# Patient Record
Sex: Male | Born: 1985 | Race: White | Hispanic: No | Smoking: Current every day smoker
Health system: Southern US, Community
[De-identification: ages and names within clinical notes are randomized; demographics above are authoritative.]

## PROBLEM LIST (undated history)

## (undated) DIAGNOSIS — F319 Bipolar disorder, unspecified: Secondary | ICD-10-CM

## (undated) DIAGNOSIS — F419 Anxiety disorder, unspecified: Secondary | ICD-10-CM

## (undated) DIAGNOSIS — R569 Unspecified convulsions: Secondary | ICD-10-CM

## (undated) DIAGNOSIS — M549 Dorsalgia, unspecified: Secondary | ICD-10-CM

## (undated) DIAGNOSIS — M542 Cervicalgia: Secondary | ICD-10-CM

## (undated) DIAGNOSIS — F32A Depression, unspecified: Secondary | ICD-10-CM

## (undated) HISTORY — DX: Depression, unspecified: F32.A

## (undated) HISTORY — PX: EYE SURGERY: SHX253

## (undated) HISTORY — PX: OTHER SURGICAL HISTORY: SHX169

---
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
## 2004-09-19 ENCOUNTER — Emergency Department (HOSPITAL_COMMUNITY): Admission: EM | Admit: 2004-09-19 | Discharge: 2004-09-19 | Payer: Self-pay | Admitting: *Deleted

## 2005-06-13 ENCOUNTER — Emergency Department (HOSPITAL_COMMUNITY): Admission: EM | Admit: 2005-06-13 | Discharge: 2005-06-13 | Payer: Self-pay | Admitting: Emergency Medicine

## 2005-06-18 ENCOUNTER — Ambulatory Visit (HOSPITAL_BASED_OUTPATIENT_CLINIC_OR_DEPARTMENT_OTHER): Admission: RE | Admit: 2005-06-18 | Discharge: 2005-06-18 | Payer: Self-pay | Admitting: Orthopedic Surgery

## 2005-06-18 ENCOUNTER — Encounter (INDEPENDENT_AMBULATORY_CARE_PROVIDER_SITE_OTHER): Payer: Self-pay | Admitting: *Deleted

## 2005-09-06 ENCOUNTER — Emergency Department (HOSPITAL_COMMUNITY): Admission: EM | Admit: 2005-09-06 | Discharge: 2005-09-07 | Payer: Self-pay | Admitting: Emergency Medicine

## 2005-09-08 ENCOUNTER — Emergency Department (HOSPITAL_COMMUNITY): Admission: EM | Admit: 2005-09-08 | Discharge: 2005-09-08 | Payer: Self-pay | Admitting: Emergency Medicine

## 2005-09-28 ENCOUNTER — Emergency Department (HOSPITAL_COMMUNITY): Admission: EM | Admit: 2005-09-28 | Discharge: 2005-09-28 | Payer: Self-pay | Admitting: Emergency Medicine

## 2006-12-13 ENCOUNTER — Emergency Department (HOSPITAL_COMMUNITY): Admission: EM | Admit: 2006-12-13 | Discharge: 2006-12-14 | Payer: Self-pay | Admitting: Emergency Medicine

## 2007-03-25 ENCOUNTER — Emergency Department (HOSPITAL_COMMUNITY): Admission: EM | Admit: 2007-03-25 | Discharge: 2007-03-25 | Payer: Self-pay | Admitting: Emergency Medicine

## 2007-04-30 ENCOUNTER — Emergency Department (HOSPITAL_COMMUNITY): Admission: EM | Admit: 2007-04-30 | Discharge: 2007-04-30 | Payer: Self-pay | Admitting: Emergency Medicine

## 2008-04-12 ENCOUNTER — Emergency Department (HOSPITAL_COMMUNITY): Admission: EM | Admit: 2008-04-12 | Discharge: 2008-04-12 | Payer: Self-pay | Admitting: Emergency Medicine

## 2008-05-01 ENCOUNTER — Ambulatory Visit (HOSPITAL_COMMUNITY): Admission: RE | Admit: 2008-05-01 | Discharge: 2008-05-01 | Payer: Self-pay | Admitting: Internal Medicine

## 2008-11-15 ENCOUNTER — Emergency Department (HOSPITAL_COMMUNITY): Admission: EM | Admit: 2008-11-15 | Discharge: 2008-11-15 | Payer: Self-pay | Admitting: Emergency Medicine

## 2010-03-22 ENCOUNTER — Ambulatory Visit (HOSPITAL_COMMUNITY)
Admission: RE | Admit: 2010-03-22 | Discharge: 2010-03-22 | Payer: Self-pay | Source: Home / Self Care | Attending: Internal Medicine | Admitting: Internal Medicine

## 2010-07-12 NOTE — Op Note (Signed)
Kyle [name], Kyle [name]                ACCOUNT NO.:  [id number]   MEDICAL RECORD NO.:  [id number]          PATIENT TYPE:  AMB   LOCATION:  DSC                          FACILITY:  [facility]   PHYSICIAN:  Artist Pais. Weingold, M.D.DATE OF BIRTH:  October 14, 1985   DATE OF PROCEDURE:  06/18/2005  DATE OF DISCHARGE:                                 OPERATIVE REPORT   PREOPERATIVE DIAGNOSIS:  Right arm ulnar nerve laceration, proximal to the  elbow.   POSTOPERATIVE DIAGNOSIS:  Right arm ulnar nerve laceration, proximal to the  elbow.   OPERATION/PROCEDURE:  Microscopic repair, right arm ulnar nerve using 9-0  nylon and a 7 mm neuro wrap tube.   SURGEON:  Artist Pais. Mina Marble, M.D.   ASSISTANT:  None.   ANESTHESIA:  General.   TOURNIQUET TIME:  One hour and five minutes.   COMPLICATIONS:  None.   DRAINS:  None.   DESCRIPTION OF PROCEDURE:  The patient was taken to the operating room and  after the induction adequate general anesthesia, the right upper extremity  was prepped and draped in the sterile fashion.  Esmarch was used to  exsanguinate the limb.  Tourniquet was then inflated to 250 mmHg.  At this  point in time an oblique laceration between the medial epicondyle and the  front process was extended proximally and distally and the fascia flaps were  raised accordingly.  Dissection was carried down to the area where the ulnar  nerve resides.  The ulnar nerve was seen and completely lacerated 5 cm  proximal to the elbow joint.  Both ends were retrieved.  The wound was  thoroughly irrigated. There was significant laceration to the medial head of  the triceps. This was debrided. This was repaired using 3-0 Vicryl.  The  nerve was then repaired under microscopic magnification using 9-0 nylon for  six sutures in an epineural repair followed by a placement of 7 mm  neurogenic tube to protect the repair.  The wound was then irrigated and  closed with 4-0 nylon.  A sterile dressing with Xeroform,  4 x 4's and a  splint with the elbow in 45 degrees of flexion was applied.  The patient  tolerated the procedure well and left the operating room in stable  condition.      Artist Pais Mina Marble, M.D.  Electronically Signed    MAW/MEDQ  D:  06/18/2005  T:  06/19/2005  Job:  [id number]

## 2010-11-14 LAB — BASIC METABOLIC PANEL
BUN: 9
CO2: 24
Calcium: 9.1
Chloride: 103
Creatinine, Ser: 0.87
GFR calc Af Amer: >60
GFR calc non Af Amer: >60
Glucose, Bld: 100 — ABNORMAL HIGH
Potassium: 4.5
Sodium: 137

## 2010-11-14 LAB — DIFFERENTIAL
Basophils Absolute: 0
Basophils Relative: 0
Eosinophils Absolute: 0.3
Eosinophils Relative: 3
Lymphocytes Relative: 18
Lymphs Abs: 1.5
Monocytes Absolute: 0.6
Monocytes Relative: 8
Neutro Abs: 5.8
Neutrophils Relative %: 71

## 2010-11-14 LAB — CBC
HCT: 42.7
Hemoglobin: 15
MCHC: 35.1
MCV: 91.6
Platelets: 191
RBC: 4.66
RDW: 14
WBC: 8.2

## 2010-11-18 LAB — INFLUENZA A+B VIRUS AG-DIRECT(RAPID)
Inflenza A Ag: POSITIVE — AB
Influenza B Ag: NEGATIVE

## 2010-12-08 ENCOUNTER — Emergency Department (HOSPITAL_COMMUNITY): Payer: Medicaid Other

## 2010-12-08 ENCOUNTER — Observation Stay (HOSPITAL_COMMUNITY)
Admission: EM | Admit: 2010-12-08 | Discharge: 2010-12-09 | Disposition: A | Discharge status: Home / Self Care | Payer: Medicaid Other | Attending: Neurological Surgery | Admitting: Neurological Surgery

## 2010-12-08 DIAGNOSIS — S129XXA Fracture of neck, unspecified, initial encounter: Secondary | ICD-10-CM

## 2010-12-08 DIAGNOSIS — S27329A Contusion of lung, unspecified, initial encounter: Secondary | ICD-10-CM

## 2010-12-08 DIAGNOSIS — G8929 Other chronic pain: Secondary | ICD-10-CM | POA: Insufficient documentation

## 2010-12-08 DIAGNOSIS — Z01812 Encounter for preprocedural laboratory examination: Secondary | ICD-10-CM | POA: Insufficient documentation

## 2010-12-08 DIAGNOSIS — F411 Generalized anxiety disorder: Secondary | ICD-10-CM

## 2010-12-08 DIAGNOSIS — M549 Dorsalgia, unspecified: Secondary | ICD-10-CM | POA: Insufficient documentation

## 2010-12-08 DIAGNOSIS — S12100A Unspecified displaced fracture of second cervical vertebra, initial encounter for closed fracture: Principal | ICD-10-CM | POA: Insufficient documentation

## 2010-12-08 DIAGNOSIS — IMO0002 Reserved for concepts with insufficient information to code with codable children: Secondary | ICD-10-CM

## 2010-12-08 DIAGNOSIS — Y998 Other external cause status: Secondary | ICD-10-CM | POA: Insufficient documentation

## 2010-12-08 LAB — DIFFERENTIAL
Basophils Relative: 0 % (ref 0–1)
Lymphocytes Relative: 23 % (ref 12–46)
Lymphs Abs: 1.7 10*3/uL (ref 0.7–4.0)
Monocytes Absolute: 0.8 10*3/uL (ref 0.1–1.0)
Monocytes Relative: 11 % (ref 3–12)
Neutro Abs: 4.6 10*3/uL (ref 1.7–7.7)
Neutrophils Relative %: 62 % (ref 43–77)

## 2010-12-08 LAB — CBC
HCT: 40.7 % (ref 39.0–52.0)
Hemoglobin: 14.3 g/dL (ref 13.0–17.0)
MCH: 32.5 pg (ref 26.0–34.0)
RBC: 4.4 MIL/uL (ref 4.22–5.81)

## 2010-12-08 LAB — POCT I-STAT, CHEM 8
BUN: 14 mg/dL (ref 6–23)
Chloride: 105 mEq/L (ref 96–112)
Creatinine, Ser: 1 mg/dL (ref 0.50–1.35)
Potassium: 3.8 mEq/L (ref 3.5–5.1)
Sodium: 142 mEq/L (ref 135–145)
TCO2: 25 mmol/L (ref 0–100)

## 2010-12-08 LAB — VALPROIC ACID LEVEL: Valproic Acid Lvl: 33.6 ug/mL — ABNORMAL LOW (ref 50.0–100.0)

## 2010-12-08 MED ORDER — IOHEXOL 300 MG/ML  SOLN
80.0000 mL | Freq: Once | INTRAMUSCULAR | Status: AC | PRN
  Administered 2010-12-08: 80 mL via INTRAVENOUS

## 2010-12-08 MED ORDER — IOHEXOL 350 MG/ML SOLN
50.0000 mL | Freq: Once | INTRAVENOUS | Status: AC | PRN
  Administered 2010-12-08: 50 mL via INTRAVENOUS
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
## 2011-01-01 ENCOUNTER — Other Ambulatory Visit: Payer: Self-pay | Admitting: Neurological Surgery

## 2011-01-01 ENCOUNTER — Ambulatory Visit
Admission: RE | Admit: 2011-01-01 | Discharge: 2011-01-01 | Disposition: A | Discharge status: Home / Self Care | Payer: No Typology Code available for payment source | Source: Ambulatory Visit | Attending: Neurological Surgery | Admitting: Neurological Surgery

## 2011-01-01 DIAGNOSIS — S12100A Unspecified displaced fracture of second cervical vertebra, initial encounter for closed fracture: Secondary | ICD-10-CM

## 2011-03-07 ENCOUNTER — Encounter (HOSPITAL_COMMUNITY): Payer: Self-pay

## 2011-03-07 ENCOUNTER — Emergency Department (HOSPITAL_COMMUNITY)
Admission: EM | Admit: 2011-03-07 | Discharge: 2011-03-07 | Disposition: A | Discharge status: Home / Self Care | Payer: Medicaid Other | Attending: Emergency Medicine | Admitting: Emergency Medicine

## 2011-03-07 ENCOUNTER — Emergency Department (HOSPITAL_COMMUNITY): Payer: Medicaid Other

## 2011-03-07 DIAGNOSIS — G40909 Epilepsy, unspecified, not intractable, without status epilepticus: Secondary | ICD-10-CM | POA: Insufficient documentation

## 2011-03-07 DIAGNOSIS — W298XXA Contact with other powered powered hand tools and household machinery, initial encounter: Secondary | ICD-10-CM | POA: Insufficient documentation

## 2011-03-07 DIAGNOSIS — M79609 Pain in unspecified limb: Secondary | ICD-10-CM | POA: Insufficient documentation

## 2011-03-07 DIAGNOSIS — S61219A Laceration without foreign body of unspecified finger without damage to nail, initial encounter: Secondary | ICD-10-CM

## 2011-03-07 DIAGNOSIS — S61209A Unspecified open wound of unspecified finger without damage to nail, initial encounter: Secondary | ICD-10-CM | POA: Insufficient documentation

## 2011-03-07 HISTORY — DX: Cervicalgia: M54.2

## 2011-03-07 HISTORY — DX: Anxiety disorder, unspecified: F41.9

## 2011-03-07 HISTORY — DX: Unspecified convulsions: R56.9

## 2011-03-07 MED ORDER — LIDOCAINE HCL (PF) 1 % IJ SOLN
INTRAMUSCULAR | Status: AC
  Administered 2011-03-07: 5 mL
  Filled 2011-03-07: qty 5

## 2011-03-07 NOTE — ED Notes (Signed)
Pt presents with laceration to right ring finger. Bleeding controlled.

## 2011-03-07 NOTE — ED Provider Notes (Signed)
History     CSN: [id number]  Arrival date & time 03/07/11  1225   First MD Initiated Contact with Patient 03/07/11 1317      Chief Complaint  Patient presents with  . Laceration    (Consider location/radiation/quality/duration/timing/severity/associated sxs/prior treatment) HPI Comments: Pt cut R ring finger with a saws-all.  The history is provided by the patient. No language interpreter was used.    Past Medical History  Diagnosis Date  . Seizures   . Anxiety   . Neck pain     History reviewed. No pertinent past surgical history.  No family history on file.  History  Substance Use Topics  . Smoking status: Current Everyday Smoker -- 1.0 packs/day  . Smokeless tobacco: Not on file  . Alcohol Use: No      Review of Systems  Skin: Positive for wound.  All other systems reviewed and are negative.    Allergies  Review of patient's allergies indicates no known allergies.  Home Medications  No current outpatient prescriptions on file.  BP 106/55  Pulse 96  Temp(Src) 98.2 F (36.8 C) (Oral)  Resp 18  SpO2 100%  Physical Exam  Nursing note and vitals reviewed. Constitutional: He is oriented to person, place, and time. He appears well-developed and well-nourished.  HENT:  Head: Normocephalic and atraumatic.  Eyes: EOM are normal.  Neck: Normal range of motion.  Cardiovascular: Normal rate, regular rhythm, normal heart sounds and intact distal pulses.   Pulmonary/Chest: Effort normal and breath sounds normal. No respiratory distress.  Abdominal: Soft. He exhibits no distension. There is no tenderness.  Musculoskeletal: Normal range of motion. He exhibits tenderness.       Right hand: He exhibits tenderness and laceration. He exhibits normal range of motion, no bony tenderness, normal two-point discrimination, normal capillary refill, no deformity and no swelling. normal sensation noted. Normal strength noted.       Hands: Neurological: He is alert and  oriented to person, place, and time.  Skin: Skin is warm and dry.  Psychiatric: He has a normal mood and affect. Judgment normal.    ED Course  LACERATION REPAIR Date/Time: 03/07/2011 3:12 PM Performed by: Worthy Rancher Authorized by: Worthy Rancher Consent: Verbal consent obtained. Written consent not obtained. Risks and benefits: risks, benefits and alternatives were discussed Consent given by: patient Patient understanding: patient states understanding of the procedure being performed Patient consent: the patient's understanding of the procedure matches consent given Site marked: the operative site was not marked Imaging studies: imaging studies available Required items: required blood products, implants, devices, and special equipment available Patient identity confirmed: verbally with patient Time out: Immediately prior to procedure a "time out" was called to verify the correct patient, procedure, equipment, support staff and site/side marked as required. Laceration length: 2.3 cm Foreign bodies: no foreign bodies Tendon involvement: none Nerve involvement: none Vascular damage: no Anesthesia: local infiltration Local anesthetic: lidocaine 1% without epinephrine Anesthetic total: 3 ml Patient sedated: no Preparation: Patient was prepped and draped in the usual sterile fashion. Irrigation solution: saline Irrigation method: syringe Amount of cleaning: extensive Debridement: none Degree of undermining: none Skin closure: 4-0 nylon Number of sutures: 4 Technique: simple Approximation: loose Approximation difficulty: simple Dressing: antibiotic ointment and 4x4 sterile gauze Patient tolerance: Patient tolerated the procedure well with no immediate complications.   (including critical care time)  Labs Reviewed - No data to display Dg Finger Ring Right  03/07/2011  *RADIOLOGY REPORT*  Clinical Data: Laceration.  RIGHT RING FINGER 2+V  Comparison: None  Findings: No  radiopaque foreign bodies.  No fracture, subluxation or dislocation.  Joint spaces are maintained.  IMPRESSION: No acute bony abnormality.  Original Report Authenticated By: Cyndie Chime, M.D.     No diagnosis found.    MDM          Worthy Rancher, PA 03/07/11 1535

## 2011-03-07 NOTE — ED Notes (Signed)
Pt's hand cleaned and dressed.

## 2011-03-08 NOTE — ED Provider Notes (Signed)
Medical screening examination/treatment/procedure(s) were performed by non-physician practitioner and as supervising physician I was immediately available for consultation/collaboration.  Merna Baldi S. Gerarda Conklin, MD 03/08/11 2230 

## 2011-03-18 ENCOUNTER — Other Ambulatory Visit: Payer: Self-pay | Admitting: Neurological Surgery

## 2011-03-18 ENCOUNTER — Ambulatory Visit
Admission: RE | Admit: 2011-03-18 | Discharge: 2011-03-18 | Disposition: A | Discharge status: Home / Self Care | Payer: Medicaid Other | Source: Ambulatory Visit | Attending: Neurological Surgery | Admitting: Neurological Surgery

## 2011-03-18 DIAGNOSIS — S12100A Unspecified displaced fracture of second cervical vertebra, initial encounter for closed fracture: Secondary | ICD-10-CM

## 2011-03-18 DIAGNOSIS — S129XXA Fracture of neck, unspecified, initial encounter: Secondary | ICD-10-CM

## 2011-04-21 ENCOUNTER — Ambulatory Visit
Admission: RE | Admit: 2011-04-21 | Discharge: 2011-04-21 | Disposition: A | Discharge status: Home / Self Care | Payer: Medicaid Other | Source: Ambulatory Visit | Attending: Neurological Surgery | Admitting: Neurological Surgery

## 2011-04-21 DIAGNOSIS — S129XXA Fracture of neck, unspecified, initial encounter: Secondary | ICD-10-CM

## 2012-04-12 ENCOUNTER — Emergency Department (HOSPITAL_COMMUNITY): Payer: Medicaid Other

## 2012-04-12 ENCOUNTER — Emergency Department (HOSPITAL_COMMUNITY)
Admission: EM | Admit: 2012-04-12 | Discharge: 2012-04-12 | Disposition: A | Discharge status: Home / Self Care | Payer: Medicaid Other | Attending: Emergency Medicine | Admitting: Emergency Medicine

## 2012-04-12 ENCOUNTER — Encounter (HOSPITAL_COMMUNITY): Payer: Self-pay | Admitting: *Deleted

## 2012-04-12 DIAGNOSIS — F411 Generalized anxiety disorder: Secondary | ICD-10-CM | POA: Insufficient documentation

## 2012-04-12 DIAGNOSIS — B349 Viral infection, unspecified: Secondary | ICD-10-CM

## 2012-04-12 DIAGNOSIS — B9789 Other viral agents as the cause of diseases classified elsewhere: Secondary | ICD-10-CM | POA: Insufficient documentation

## 2012-04-12 DIAGNOSIS — Z79899 Other long term (current) drug therapy: Secondary | ICD-10-CM | POA: Insufficient documentation

## 2012-04-12 DIAGNOSIS — J069 Acute upper respiratory infection, unspecified: Secondary | ICD-10-CM

## 2012-04-12 DIAGNOSIS — J3489 Other specified disorders of nose and nasal sinuses: Secondary | ICD-10-CM | POA: Insufficient documentation

## 2012-04-12 DIAGNOSIS — J029 Acute pharyngitis, unspecified: Secondary | ICD-10-CM | POA: Insufficient documentation

## 2012-04-12 DIAGNOSIS — G40909 Epilepsy, unspecified, not intractable, without status epilepticus: Secondary | ICD-10-CM | POA: Insufficient documentation

## 2012-04-12 DIAGNOSIS — R110 Nausea: Secondary | ICD-10-CM | POA: Insufficient documentation

## 2012-04-12 DIAGNOSIS — R197 Diarrhea, unspecified: Secondary | ICD-10-CM | POA: Insufficient documentation

## 2012-04-12 DIAGNOSIS — F17200 Nicotine dependence, unspecified, uncomplicated: Secondary | ICD-10-CM | POA: Insufficient documentation

## 2012-04-12 DIAGNOSIS — R51 Headache: Secondary | ICD-10-CM | POA: Insufficient documentation

## 2012-04-12 HISTORY — DX: Dorsalgia, unspecified: M54.9

## 2012-04-12 LAB — RAPID STREP SCREEN (MED CTR MEBANE ONLY): Streptococcus, Group A Screen (Direct): NEGATIVE

## 2012-04-12 MED ORDER — BENZONATATE 100 MG PO CAPS: 100.0000 mg | ORAL_CAPSULE | Freq: Three times a day (TID) | ORAL | Status: DC | PRN

## 2012-04-12 NOTE — ED Notes (Signed)
Nausea,diarrheax3-4 ,no vomiting.  Cough, sneezing, headache

## 2012-04-12 NOTE — ED Notes (Signed)
Discharge instructions reviewed with pt, questions answered. Pt verbalized understanding.  

## 2012-04-12 NOTE — ED Provider Notes (Signed)
History     CSN: [id number]  Arrival date & time 04/12/12  2013   First MD Initiated Contact with Patient 04/12/12 2021      Chief Complaint  Patient presents with  . Cough     HPI Pt was seen at 2025.   Per pt, c/o gradual onset and persistence of constant sore throat, runny/stuffy nose, sinus congestion, sneezing, cough, nausea, and several episodes of "diarrhea" for the past 3 to 4 days.  Denies fevers, no rash, no CP/SOB, no N/V/D, no abd pain.     Past Medical History  Diagnosis Date  . Anxiety   . Neck pain   . Seizures   . Back pain     Past Surgical History  Procedure Laterality Date  . Arm surgery       History  Substance Use Topics  . Smoking status: Current Every Day Smoker -- 1.00 packs/day  . Smokeless tobacco: Not on file  . Alcohol Use: No    Review of Systems ROS: Statement: All systems negative except as marked or noted in the HPI; Constitutional: Negative for fever and chills. ; ; Eyes: Negative for eye pain, redness and discharge. ; ; ENMT: Negative for ear pain, hoarseness, +nasal congestion, runny/stuffy nose, sneezing, sinus pressure and sore throat. ; ; Cardiovascular: Negative for chest pain, palpitations, diaphoresis, dyspnea and peripheral edema. ; ; Respiratory: +cough. Negative for wheezing and stridor. ; ; Gastrointestinal: +nausea, diarrhea. Negative for vomiting, abdominal pain, blood in stool, hematemesis, jaundice and rectal bleeding. . ; ; Genitourinary: Negative for dysuria, flank pain and hematuria. ; ; Musculoskeletal: Negative for back pain and neck pain. Negative for swelling and trauma.; ; Skin: Negative for pruritus, rash, abrasions, blisters, bruising and skin lesion.; ; Neuro: Negative for headache, lightheadedness and neck stiffness. Negative for weakness, altered level of consciousness , altered mental status, extremity weakness, paresthesias, involuntary movement, seizure and syncope.       Allergies  Review of patient's  allergies indicates no known allergies.  Home Medications   Current Outpatient Rx  Name  Route  Sig  Dispense  Refill  . alprazolam (XANAX) 2 MG tablet   Oral   Take 2 mg by mouth 4 (four) times daily.         Marland Kitchen Dextromethorphan-Guaifenesin (CHEST CONGESTION/COUGH RELIEF) 20-400 MG TABS   Oral   Take 2 tablets by mouth 2 (two) times daily as needed (for cough, cold, and congestion relief).         Marland Kitchen divalproex (DEPAKOTE) 250 MG DR tablet   Oral   Take 250 mg by mouth 3 (three) times daily.         Marland Kitchen HYDROcodone-acetaminophen (LORTAB) 10-500 MG per tablet   Oral   Take 1 tablet by mouth every 6 (six) hours as needed for pain.         . benzonatate (TESSALON) 100 MG capsule   Oral   Take 1 capsule (100 mg total) by mouth 3 (three) times daily as needed for cough.   15 capsule   0     BP 115/67  Pulse 74  Temp(Src) 98.1 F (36.7 C) (Oral)  Resp 20  Ht 5\' 10"  (1.778 m)  Wt 130 lb (58.968 kg)  BMI 18.65 kg/m2  SpO2 100%  Physical Exam 2030: Physical examination:  Nursing notes reviewed; Vital signs and O2 SAT reviewed;  Constitutional: Well developed, Well nourished, Well hydrated, In no acute distress; Head:  Normocephalic, atraumatic; Eyes: EOMI, PERRL, No  scleral icterus; ENMT: TM's clear bilat. +edemetous nasal turbinates bilat with clear rhinorrhea.  Mouth and pharynx normal, Mucous membranes moist; Neck: Supple, Full range of motion, No lymphadenopathy; Cardiovascular: Regular rate and rhythm, No murmur, rub, or gallop; Respiratory: Breath sounds clear & equal bilaterally, No rales, rhonchi, wheezes.  Speaking full sentences with ease, Normal respiratory effort/excursion; Chest: Nontender, Movement normal; Abdomen: Soft, Nontender, Nondistended, Normal bowel sounds; Genitourinary: No CVA tenderness; Extremities: Pulses normal, No tenderness, No edema, No calf edema or asymmetry.; Neuro: AA&Ox3, Major CN grossly intact.  Speech clear. Gait steady. No gross focal  motor or sensory deficits in extremities.; Skin: Color normal, Warm, Dry.   ED Course  Procedures      MDM  MDM Reviewed: nursing note and vitals Interpretation: x-ray and labs     Results for orders placed during the hospital encounter of 04/12/12  RAPID STREP SCREEN      Result Value Range   Streptococcus, Group A Screen (Direct) NEGATIVE  NEGATIVE   Dg Chest 2 View 04/12/2012  *RADIOLOGY REPORT*  Clinical Data: Cough.  CHEST - 2 VIEW  Comparison: CT 12/08/2010  Findings: Lungs mildly hyperinflated. Lungs clear.  Heart size and pulmonary vascularity normal.  No effusion.  Visualized bones unremarkable.  IMPRESSION: No acute disease   Original Report Authenticated By: D. Andria Rhein, MD      2110:  No acute findings on workup.  Will tx symptomatically at this time. Dx and testing d/w pt and family.  Questions answered.  Verb understanding, agreeable to d/c home with outpt f/u.       Laray Anger, DO 04/13/12 1332

## 2012-05-13 ENCOUNTER — Other Ambulatory Visit (HOSPITAL_COMMUNITY): Payer: Self-pay | Admitting: Internal Medicine

## 2012-05-17 ENCOUNTER — Encounter (HOSPITAL_COMMUNITY): Payer: Self-pay

## 2012-05-17 ENCOUNTER — Ambulatory Visit (HOSPITAL_COMMUNITY)
Admission: RE | Admit: 2012-05-17 | Discharge: 2012-05-17 | Disposition: A | Discharge status: Home / Self Care | Payer: Medicaid Other | Source: Ambulatory Visit | Attending: Internal Medicine | Admitting: Internal Medicine

## 2012-05-17 DIAGNOSIS — M545 Low back pain, unspecified: Secondary | ICD-10-CM | POA: Insufficient documentation

## 2012-05-17 DIAGNOSIS — M5126 Other intervertebral disc displacement, lumbar region: Secondary | ICD-10-CM | POA: Insufficient documentation

## 2012-05-17 DIAGNOSIS — M549 Dorsalgia, unspecified: Secondary | ICD-10-CM

## 2012-11-08 ENCOUNTER — Ambulatory Visit: Payer: Self-pay | Admitting: Family Medicine

## 2020-02-25 ENCOUNTER — Emergency Department (HOSPITAL_COMMUNITY): Payer: Self-pay

## 2020-02-25 ENCOUNTER — Inpatient Hospital Stay (HOSPITAL_COMMUNITY)
Admission: EM | Admit: 2020-02-25 | Discharge: 2020-03-07 | DRG: 957 | Disposition: A | Discharge status: Other Acute Inpatient Hospital | Payer: Self-pay | Attending: Surgery | Admitting: Surgery

## 2020-02-25 ENCOUNTER — Encounter (HOSPITAL_COMMUNITY)
Admission: EM | Disposition: A | Discharge status: Other Acute Inpatient Hospital | Payer: Self-pay | Source: Home / Self Care

## 2020-02-25 ENCOUNTER — Encounter (HOSPITAL_COMMUNITY): Payer: Self-pay

## 2020-02-25 ENCOUNTER — Inpatient Hospital Stay (HOSPITAL_COMMUNITY): Payer: Self-pay | Admitting: Certified Registered"

## 2020-02-25 ENCOUNTER — Inpatient Hospital Stay (HOSPITAL_COMMUNITY): Payer: Self-pay

## 2020-02-25 DIAGNOSIS — S32412A Displaced fracture of anterior wall of left acetabulum, initial encounter for closed fracture: Secondary | ICD-10-CM | POA: Diagnosis present

## 2020-02-25 DIAGNOSIS — E871 Hypo-osmolality and hyponatremia: Secondary | ICD-10-CM | POA: Diagnosis not present

## 2020-02-25 DIAGNOSIS — R319 Hematuria, unspecified: Secondary | ICD-10-CM | POA: Diagnosis present

## 2020-02-25 DIAGNOSIS — Z20822 Contact with and (suspected) exposure to covid-19: Secondary | ICD-10-CM | POA: Diagnosis present

## 2020-02-25 DIAGNOSIS — S32592A Other specified fracture of left pubis, initial encounter for closed fracture: Secondary | ICD-10-CM | POA: Diagnosis present

## 2020-02-25 DIAGNOSIS — F10239 Alcohol dependence with withdrawal, unspecified: Secondary | ICD-10-CM | POA: Diagnosis present

## 2020-02-25 DIAGNOSIS — D62 Acute posthemorrhagic anemia: Secondary | ICD-10-CM | POA: Diagnosis not present

## 2020-02-25 DIAGNOSIS — S32412B Displaced fracture of anterior wall of left acetabulum, initial encounter for open fracture: Secondary | ICD-10-CM

## 2020-02-25 DIAGNOSIS — L409 Psoriasis, unspecified: Secondary | ICD-10-CM | POA: Diagnosis present

## 2020-02-25 DIAGNOSIS — R45851 Suicidal ideations: Secondary | ICD-10-CM | POA: Diagnosis present

## 2020-02-25 DIAGNOSIS — S3729XA Other injury of bladder, initial encounter: Secondary | ICD-10-CM | POA: Diagnosis present

## 2020-02-25 DIAGNOSIS — J9601 Acute respiratory failure with hypoxia: Secondary | ICD-10-CM | POA: Diagnosis present

## 2020-02-25 DIAGNOSIS — S52522A Torus fracture of lower end of left radius, initial encounter for closed fracture: Secondary | ICD-10-CM

## 2020-02-25 DIAGNOSIS — S5291XA Unspecified fracture of right forearm, initial encounter for closed fracture: Secondary | ICD-10-CM | POA: Diagnosis present

## 2020-02-25 DIAGNOSIS — S3720XA Unspecified injury of bladder, initial encounter: Secondary | ICD-10-CM | POA: Diagnosis present

## 2020-02-25 DIAGNOSIS — F319 Bipolar disorder, unspecified: Secondary | ICD-10-CM | POA: Diagnosis present

## 2020-02-25 DIAGNOSIS — R058 Other specified cough: Secondary | ICD-10-CM

## 2020-02-25 DIAGNOSIS — J9690 Respiratory failure, unspecified, unspecified whether with hypoxia or hypercapnia: Secondary | ICD-10-CM

## 2020-02-25 DIAGNOSIS — S31639A Puncture wound without foreign body of abdominal wall, unspecified quadrant with penetration into peritoneal cavity, initial encounter: Principal | ICD-10-CM | POA: Diagnosis present

## 2020-02-25 DIAGNOSIS — Z4659 Encounter for fitting and adjustment of other gastrointestinal appliance and device: Secondary | ICD-10-CM

## 2020-02-25 DIAGNOSIS — S36593A Other injury of sigmoid colon, initial encounter: Secondary | ICD-10-CM | POA: Diagnosis present

## 2020-02-25 DIAGNOSIS — D72829 Elevated white blood cell count, unspecified: Secondary | ICD-10-CM

## 2020-02-25 DIAGNOSIS — S31609A Unspecified open wound of abdominal wall, unspecified quadrant with penetration into peritoneal cavity, initial encounter: Secondary | ICD-10-CM | POA: Diagnosis present

## 2020-02-25 DIAGNOSIS — Z23 Encounter for immunization: Secondary | ICD-10-CM

## 2020-02-25 DIAGNOSIS — W3400XA Accidental discharge from unspecified firearms or gun, initial encounter: Principal | ICD-10-CM

## 2020-02-25 DIAGNOSIS — Y907 Blood alcohol level of 200-239 mg/100 ml: Secondary | ICD-10-CM | POA: Diagnosis present

## 2020-02-25 DIAGNOSIS — S36499A Other injury of unspecified part of small intestine, initial encounter: Secondary | ICD-10-CM | POA: Diagnosis present

## 2020-02-25 HISTORY — PX: BOWEL RESECTION: SHX1257

## 2020-02-25 HISTORY — PX: BLADDER REPAIR: SHX6721

## 2020-02-25 HISTORY — DX: Bipolar disorder, unspecified: F31.9

## 2020-02-25 HISTORY — PX: LAPAROTOMY: SHX154

## 2020-02-25 HISTORY — DX: Anxiety disorder, unspecified: F41.9

## 2020-02-25 LAB — POCT I-STAT 7, (LYTES, BLD GAS, ICA,H+H)
Acid-base deficit: 12 mmol/L — ABNORMAL HIGH (ref 0.0–2.0)
Acid-base deficit: 5 mmol/L — ABNORMAL HIGH (ref 0.0–2.0)
Acid-base deficit: 8 mmol/L — ABNORMAL HIGH (ref 0.0–2.0)
Bicarbonate: 15.1 mmol/L — ABNORMAL LOW (ref 20.0–28.0)
Bicarbonate: 19.4 mmol/L — ABNORMAL LOW (ref 20.0–28.0)
Bicarbonate: 21.4 mmol/L (ref 20.0–28.0)
Calcium, Ion: 0.97 mmol/L — ABNORMAL LOW (ref 1.15–1.40)
Calcium, Ion: 1.06 mmol/L — ABNORMAL LOW (ref 1.15–1.40)
Calcium, Ion: 1.23 mmol/L (ref 1.15–1.40)
HCT: 24 % — ABNORMAL LOW (ref 39.0–52.0)
HCT: 24 % — ABNORMAL LOW (ref 39.0–52.0)
HCT: 25 % — ABNORMAL LOW (ref 39.0–52.0)
Hemoglobin: 8.2 g/dL — ABNORMAL LOW (ref 13.0–17.0)
Hemoglobin: 8.2 g/dL — ABNORMAL LOW (ref 13.0–17.0)
Hemoglobin: 8.5 g/dL — ABNORMAL LOW (ref 13.0–17.0)
O2 Saturation: 100 %
O2 Saturation: 99 %
O2 Saturation: 99 %
Potassium: 2.7 mmol/L — CL (ref 3.5–5.1)
Potassium: 3.3 mmol/L — ABNORMAL LOW (ref 3.5–5.1)
Potassium: 3.6 mmol/L (ref 3.5–5.1)
Sodium: 148 mmol/L — ABNORMAL HIGH (ref 135–145)
Sodium: 145 mmol/L (ref 135–145)
Sodium: 146 mmol/L — ABNORMAL HIGH (ref 135–145)
TCO2: 16 mmol/L — ABNORMAL LOW (ref 22–32)
TCO2: 21 mmol/L — ABNORMAL LOW (ref 22–32)
TCO2: 23 mmol/L (ref 22–32)
pCO2 arterial: 38.3 mmHg (ref 32.0–48.0)
pCO2 arterial: 44.2 mmHg (ref 32.0–48.0)
pCO2 arterial: 45.1 mmHg (ref 32.0–48.0)
pH, Arterial: 7.203 — ABNORMAL LOW (ref 7.350–7.450)
pH, Arterial: 7.242 — ABNORMAL LOW (ref 7.350–7.450)
pH, Arterial: 7.292 — ABNORMAL LOW (ref 7.350–7.450)
pO2, Arterial: 305 mmHg — ABNORMAL HIGH (ref 83.0–108.0)
pO2, Arterial: 145 mmHg — ABNORMAL HIGH (ref 83.0–108.0)
pO2, Arterial: 179 mmHg — ABNORMAL HIGH (ref 83.0–108.0)

## 2020-02-25 LAB — ETHANOL: Alcohol, Ethyl (B): 232 mg/dL — ABNORMAL HIGH (ref <10)

## 2020-02-25 LAB — I-STAT CHEM 8, ED
BUN: 11 mg/dL (ref 6–20)
Calcium, Ion: 1.17 mmol/L (ref 1.15–1.40)
Chloride: 109 mmol/L (ref 98–111)
Creatinine, Ser: 1.1 mg/dL (ref 0.61–1.24)
Glucose, Bld: 155 mg/dL — ABNORMAL HIGH (ref 70–99)
HCT: 41 % (ref 39.0–52.0)
Hemoglobin: 13.9 g/dL (ref 13.0–17.0)
Potassium: 3.4 mmol/L — ABNORMAL LOW (ref 3.5–5.1)
Sodium: 145 mmol/L (ref 135–145)
TCO2: 21 mmol/L — ABNORMAL LOW (ref 22–32)

## 2020-02-25 LAB — URINALYSIS, ROUTINE W REFLEX MICROSCOPIC

## 2020-02-25 LAB — URINALYSIS, MICROSCOPIC (REFLEX)

## 2020-02-25 LAB — COMPREHENSIVE METABOLIC PANEL
ALT: 20 U/L (ref 0–44)
AST: 28 U/L (ref 15–41)
Albumin: 3.9 g/dL (ref 3.5–5.0)
Alkaline Phosphatase: 87 U/L (ref 38–126)
Anion gap: 14 (ref 5–15)
BUN: 10 mg/dL (ref 6–20)
CO2: 21 mmol/L — ABNORMAL LOW (ref 22–32)
Calcium: 8.9 mg/dL (ref 8.9–10.3)
Chloride: 109 mmol/L (ref 98–111)
Creatinine, Ser: 1.03 mg/dL (ref 0.61–1.24)
GFR, Estimated: >60 mL/min (ref >60)
Glucose, Bld: 158 mg/dL — ABNORMAL HIGH (ref 70–99)
Potassium: 3.4 mmol/L — ABNORMAL LOW (ref 3.5–5.1)
Sodium: 144 mmol/L (ref 135–145)
Total Bilirubin: 0.5 mg/dL (ref 0.3–1.2)
Total Protein: 7 g/dL (ref 6.5–8.1)

## 2020-02-25 LAB — CBC
HCT: 41.5 % (ref 39.0–52.0)
HCT: 33.1 % — ABNORMAL LOW (ref 39.0–52.0)
Hemoglobin: 13.9 g/dL (ref 13.0–17.0)
Hemoglobin: 11.4 g/dL — ABNORMAL LOW (ref 13.0–17.0)
MCH: 31.5 pg (ref 26.0–34.0)
MCH: 31.9 pg (ref 26.0–34.0)
MCHC: 33.5 g/dL (ref 30.0–36.0)
MCHC: 34.4 g/dL (ref 30.0–36.0)
MCV: 94.1 fL (ref 80.0–100.0)
MCV: 92.7 fL (ref 80.0–100.0)
Platelets: 151 10*3/uL (ref 150–400)
Platelets: 172 10*3/uL (ref 150–400)
RBC: 4.41 MIL/uL (ref 4.22–5.81)
RBC: 3.57 MIL/uL — ABNORMAL LOW (ref 4.22–5.81)
RDW: 12.9 % (ref 11.5–15.5)
RDW: 13.3 % (ref 11.5–15.5)
WBC: 13.8 10*3/uL — ABNORMAL HIGH (ref 4.0–10.5)
WBC: 9.3 10*3/uL (ref 4.0–10.5)
nRBC: 0 % (ref 0.0–0.2)
nRBC: 0 % (ref 0.0–0.2)

## 2020-02-25 LAB — RESP PANEL BY RT-PCR (FLU A&B, COVID) ARPGX2
Influenza A by PCR: NEGATIVE
Influenza B by PCR: NEGATIVE
SARS Coronavirus 2 by RT PCR: NEGATIVE

## 2020-02-25 LAB — PROTIME-INR
INR: 1.1 (ref 0.8–1.2)
Prothrombin Time: 13.9 seconds (ref 11.4–15.2)

## 2020-02-25 LAB — LACTIC ACID, PLASMA: Lactic Acid, Venous: 3.4 mmol/L (ref 0.5–1.9)

## 2020-02-25 LAB — CREATININE, SERUM
Creatinine, Ser: 0.6 mg/dL — ABNORMAL LOW (ref 0.61–1.24)
GFR, Estimated: >60 mL/min (ref >60)

## 2020-02-25 LAB — SAMPLE TO BLOOD BANK

## 2020-02-25 LAB — ABO/RH: ABO/RH(D): O NEG

## 2020-02-25 LAB — SURGICAL PCR SCREEN
MRSA, PCR: NEGATIVE
Staphylococcus aureus: NEGATIVE

## 2020-02-25 LAB — HIV ANTIBODY (ROUTINE TESTING W REFLEX): HIV Screen 4th Generation wRfx: NONREACTIVE

## 2020-02-25 SURGERY — LAPAROTOMY, EXPLORATORY
Anesthesia: General | Site: Abdomen

## 2020-02-25 MED ORDER — FENTANYL CITRATE (PF) 100 MCG/2ML IJ SOLN
50.0000 ug | Freq: Once | INTRAMUSCULAR | Status: DC
Start: 2020-02-25 — End: 2020-02-26

## 2020-02-25 MED ORDER — CHLORHEXIDINE GLUCONATE CLOTH 2 % EX PADS
6.0000 | MEDICATED_PAD | Freq: Every day | CUTANEOUS | Status: DC
  Administered 2020-02-25 – 2020-03-07 (×8): 6 via TOPICAL

## 2020-02-25 MED ORDER — FENTANYL BOLUS VIA INFUSION
50.0000 ug | INTRAVENOUS | Status: DC | PRN
  Filled 2020-02-25: qty 50

## 2020-02-25 MED ORDER — VECURONIUM BROMIDE 10 MG IV SOLR
INTRAVENOUS | Status: AC
  Filled 2020-02-25: qty 10

## 2020-02-25 MED ORDER — DOCUSATE SODIUM 50 MG/5ML PO LIQD
100.0000 mg | Freq: Two times a day (BID) | ORAL | Status: DC
  Administered 2020-02-25 – 2020-02-26 (×2): 100 mg
  Filled 2020-02-25 (×2): qty 10

## 2020-02-25 MED ORDER — CALCIUM CHLORIDE 10 % IV SOLN
INTRAVENOUS | Status: AC
  Filled 2020-02-25: qty 10

## 2020-02-25 MED ORDER — POLYETHYLENE GLYCOL 3350 17 G PO PACK
17.0000 g | PACK | Freq: Every day | ORAL | Status: DC
  Administered 2020-02-26: 17 g
  Filled 2020-02-25: qty 1

## 2020-02-25 MED ORDER — FENTANYL CITRATE (PF) 100 MCG/2ML IJ SOLN: 50.0000 ug | INTRAMUSCULAR | Status: DC | PRN

## 2020-02-25 MED ORDER — SODIUM CHLORIDE 0.9% FLUSH
10.0000 mL | Freq: Two times a day (BID) | INTRAVENOUS | Status: DC
  Administered 2020-02-25: 10 mL
  Administered 2020-02-25: 20 mL
  Administered 2020-02-26: 10 mL
  Administered 2020-02-26 – 2020-02-27 (×2): 20 mL
  Administered 2020-02-28 – 2020-03-07 (×11): 10 mL

## 2020-02-25 MED ORDER — LACTATED RINGERS IV SOLN: INTRAVENOUS | Status: DC | PRN

## 2020-02-25 MED ORDER — PROPOFOL 1000 MG/100ML IV EMUL
INTRAVENOUS | Status: AC
  Administered 2020-02-25: 10 ug/kg/min
  Filled 2020-02-25: qty 100

## 2020-02-25 MED ORDER — PROPOFOL 1000 MG/100ML IV EMUL
INTRAVENOUS | Status: AC
  Administered 2020-02-25: 50 ug/kg/min
  Filled 2020-02-25: qty 100

## 2020-02-25 MED ORDER — 0.9 % SODIUM CHLORIDE (POUR BTL) OPTIME
TOPICAL | Status: DC | PRN
  Administered 2020-02-25 (×2): 1000 mL

## 2020-02-25 MED ORDER — SODIUM CHLORIDE 0.9 % IV SOLN: INTRAVENOUS | Status: DC | PRN

## 2020-02-25 MED ORDER — FENTANYL CITRATE (PF) 250 MCG/5ML IJ SOLN
INTRAMUSCULAR | Status: AC
  Filled 2020-02-25: qty 5

## 2020-02-25 MED ORDER — METRONIDAZOLE IN NACL 5-0.79 MG/ML-% IV SOLN
500.0000 mg | INTRAVENOUS | Status: AC
  Administered 2020-02-25: 500 mg via INTRAVENOUS
  Filled 2020-02-25: qty 100

## 2020-02-25 MED ORDER — CHLORHEXIDINE GLUCONATE 0.12% ORAL RINSE (MEDLINE KIT)
15.0000 mL | Freq: Two times a day (BID) | OROMUCOSAL | Status: DC
  Administered 2020-02-25 – 2020-03-06 (×11): 15 mL via OROMUCOSAL

## 2020-02-25 MED ORDER — MIDAZOLAM HCL 5 MG/5ML IJ SOLN
INTRAMUSCULAR | Status: DC | PRN
  Administered 2020-02-25: 2 mg via INTRAVENOUS

## 2020-02-25 MED ORDER — MIDAZOLAM HCL 2 MG/2ML IJ SOLN
2.0000 mg | INTRAMUSCULAR | Status: DC | PRN
  Filled 2020-02-25: qty 2

## 2020-02-25 MED ORDER — ENOXAPARIN SODIUM 30 MG/0.3ML ~~LOC~~ SOLN
30.0000 mg | Freq: Two times a day (BID) | SUBCUTANEOUS | Status: DC
  Administered 2020-02-26 – 2020-03-06 (×20): 30 mg via SUBCUTANEOUS
  Filled 2020-02-25 (×21): qty 0.3

## 2020-02-25 MED ORDER — IOHEXOL 300 MG/ML  SOLN
100.0000 mL | Freq: Once | INTRAMUSCULAR | Status: AC | PRN
  Administered 2020-02-25: 100 mL via INTRAVENOUS

## 2020-02-25 MED ORDER — PROPOFOL 1000 MG/100ML IV EMUL
0.0000 ug/kg/min | INTRAVENOUS | Status: DC
  Administered 2020-02-25: 50 ug/kg/min via INTRAVENOUS

## 2020-02-25 MED ORDER — MIDAZOLAM HCL 2 MG/2ML IJ SOLN
INTRAMUSCULAR | Status: AC
  Filled 2020-02-25: qty 2

## 2020-02-25 MED ORDER — ONDANSETRON 4 MG PO TBDP
4.0000 mg | ORAL_TABLET | Freq: Four times a day (QID) | ORAL | Status: DC | PRN
  Administered 2020-02-28: 4 mg via ORAL
  Filled 2020-02-25: qty 1

## 2020-02-25 MED ORDER — PHENYLEPHRINE HCL-NACL 10-0.9 MG/250ML-% IV SOLN
INTRAVENOUS | Status: DC | PRN
  Administered 2020-02-25: 50 ug/min via INTRAVENOUS

## 2020-02-25 MED ORDER — SODIUM CHLORIDE 0.9% FLUSH
10.0000 mL | INTRAVENOUS | Status: DC | PRN
Start: 2020-02-25 — End: 2020-03-07

## 2020-02-25 MED ORDER — ETOMIDATE 2 MG/ML IV SOLN
20.0000 mg | Freq: Once | INTRAVENOUS | Status: AC
  Administered 2020-02-25: 20 mg via INTRAVENOUS

## 2020-02-25 MED ORDER — CEFAZOLIN SODIUM 1 G IJ SOLR
INTRAMUSCULAR | Status: AC
  Filled 2020-02-25: qty 30

## 2020-02-25 MED ORDER — FENTANYL 2500MCG IN NS 250ML (10MCG/ML) PREMIX INFUSION
50.0000 ug/h | INTRAVENOUS | Status: DC
  Administered 2020-02-25 – 2020-02-26 (×3): 200 ug/h via INTRAVENOUS
  Filled 2020-02-25 (×4): qty 250

## 2020-02-25 MED ORDER — DEXMEDETOMIDINE HCL IN NACL 400 MCG/100ML IV SOLN
0.4000 ug/kg/h | INTRAVENOUS | Status: DC
  Administered 2020-02-26: 1 ug/kg/h via INTRAVENOUS
  Filled 2020-02-25: qty 100

## 2020-02-25 MED ORDER — SODIUM BICARBONATE 8.4 % IV SOLN
INTRAVENOUS | Status: AC
  Filled 2020-02-25: qty 50

## 2020-02-25 MED ORDER — CALCIUM CHLORIDE 10 % IV SOLN
INTRAVENOUS | Status: DC | PRN
  Administered 2020-02-25: 200 mg via INTRAVENOUS
  Administered 2020-02-25 (×2): 300 mg via INTRAVENOUS
  Administered 2020-02-25: 200 mg via INTRAVENOUS

## 2020-02-25 MED ORDER — ALBUMIN HUMAN 5 % IV SOLN: INTRAVENOUS | Status: DC | PRN

## 2020-02-25 MED ORDER — FENTANYL CITRATE (PF) 100 MCG/2ML IJ SOLN
50.0000 ug | INTRAMUSCULAR | Status: DC | PRN
  Filled 2020-02-25: qty 2

## 2020-02-25 MED ORDER — ONDANSETRON HCL 4 MG/2ML IJ SOLN
4.0000 mg | Freq: Four times a day (QID) | INTRAMUSCULAR | Status: DC | PRN
  Administered 2020-02-28 – 2020-02-29 (×3): 4 mg via INTRAVENOUS
  Filled 2020-02-25 (×3): qty 2

## 2020-02-25 MED ORDER — FENTANYL CITRATE (PF) 100 MCG/2ML IJ SOLN
INTRAMUSCULAR | Status: DC | PRN
  Administered 2020-02-25 (×5): 50 ug via INTRAVENOUS

## 2020-02-25 MED ORDER — SODIUM CHLORIDE 0.9 % IV SOLN: INTRAVENOUS | Status: DC

## 2020-02-25 MED ORDER — SUCCINYLCHOLINE CHLORIDE 20 MG/ML IJ SOLN
100.0000 mg | Freq: Once | INTRAMUSCULAR | Status: AC
  Administered 2020-02-25: 100 mg via INTRAVENOUS

## 2020-02-25 MED ORDER — DEXMEDETOMIDINE HCL IN NACL 400 MCG/100ML IV SOLN: 0.4000 ug/kg/h | INTRAVENOUS | Status: DC

## 2020-02-25 MED ORDER — PROPOFOL 1000 MG/100ML IV EMUL
0.0000 ug/kg/min | INTRAVENOUS | Status: DC
  Administered 2020-02-25 – 2020-02-26 (×3): 30 ug/kg/min via INTRAVENOUS
  Filled 2020-02-25 (×3): qty 100

## 2020-02-25 MED ORDER — VECURONIUM BROMIDE 10 MG IV SOLR
10.0000 mg | Freq: Once | INTRAVENOUS | Status: AC
  Administered 2020-02-25: 10 mg via INTRAVENOUS

## 2020-02-25 MED ORDER — CEFAZOLIN SODIUM-DEXTROSE 2-3 GM-%(50ML) IV SOLR
INTRAVENOUS | Status: DC | PRN
  Administered 2020-02-25: 2 g via INTRAVENOUS

## 2020-02-25 MED ORDER — CHLORHEXIDINE GLUCONATE CLOTH 2 % EX PADS: 6.0000 | MEDICATED_PAD | Freq: Every day | CUTANEOUS | Status: DC

## 2020-02-25 MED ORDER — ROCURONIUM BROMIDE 10 MG/ML (PF) SYRINGE
PREFILLED_SYRINGE | INTRAVENOUS | Status: DC | PRN
  Administered 2020-02-25: 30 mg via INTRAVENOUS
  Administered 2020-02-25: 100 mg via INTRAVENOUS

## 2020-02-25 MED ORDER — SODIUM CHLORIDE 0.9 % IV SOLN
INTRAVENOUS | Status: AC | PRN
  Administered 2020-02-25: 2000 mL via INTRAVENOUS

## 2020-02-25 MED ORDER — ORAL CARE MOUTH RINSE
15.0000 mL | OROMUCOSAL | Status: DC
  Administered 2020-02-25 – 2020-02-26 (×11): 15 mL via OROMUCOSAL

## 2020-02-25 MED ORDER — ROCURONIUM BROMIDE 10 MG/ML (PF) SYRINGE
PREFILLED_SYRINGE | INTRAVENOUS | Status: AC
  Filled 2020-02-25: qty 10

## 2020-02-25 MED ORDER — SODIUM BICARBONATE 4.2 % IV SOLN
INTRAVENOUS | Status: DC | PRN
  Administered 2020-02-25 (×4): 50 meq via INTRAVENOUS

## 2020-02-25 MED ORDER — PROPOFOL 10 MG/ML IV BOLUS
INTRAVENOUS | Status: AC
  Filled 2020-02-25: qty 40

## 2020-02-25 MED ORDER — PHENYLEPHRINE 40 MCG/ML (10ML) SYRINGE FOR IV PUSH (FOR BLOOD PRESSURE SUPPORT)
PREFILLED_SYRINGE | INTRAVENOUS | Status: DC | PRN
  Administered 2020-02-25: 120 ug via INTRAVENOUS
  Administered 2020-02-25 (×2): 80 ug via INTRAVENOUS

## 2020-02-25 MED ORDER — MIDAZOLAM HCL 2 MG/2ML IJ SOLN: 2.0000 mg | INTRAMUSCULAR | Status: DC | PRN

## 2020-02-25 MED ORDER — PROPOFOL 500 MG/50ML IV EMUL
INTRAVENOUS | Status: DC | PRN
  Administered 2020-02-25: 150 ug/kg/min via INTRAVENOUS

## 2020-02-25 SURGICAL SUPPLY — 63 items
ADAPTER CATH SYR TO TUBING 38M (ADAPTER) ×1 IMPLANT
ADPR CATH LL SYR 3/32 TPR (ADAPTER) ×1
APL PRP STRL LF DISP 70% ISPRP (MISCELLANEOUS) ×1
BAG DRN RND TRDRP ANRFLXCHMBR (UROLOGICAL SUPPLIES) ×1
BAG URINE DRAIN 2000ML AR STRL (UROLOGICAL SUPPLIES) ×1 IMPLANT
BLADE CLIPPER SURG (BLADE) ×1 IMPLANT
CANISTER SUCT 3000ML PPV (MISCELLANEOUS) ×2 IMPLANT
CATH FOLEY 3WAY 30CC 22FR (CATHETERS) ×1 IMPLANT
CHLORAPREP W/TINT 26 (MISCELLANEOUS) ×2 IMPLANT
COVER SURGICAL LIGHT HANDLE (MISCELLANEOUS) ×2 IMPLANT
DRAIN CHANNEL 19F RND (DRAIN) ×1 IMPLANT
DRAIN PENROSE 1/2X12 LTX STRL (WOUND CARE) ×1 IMPLANT
DRAPE LAPAROSCOPIC ABDOMINAL (DRAPES) ×2 IMPLANT
DRAPE WARM FLUID 44X44 (DRAPES) ×2 IMPLANT
DRSG OPSITE POSTOP 4X10 (GAUZE/BANDAGES/DRESSINGS) IMPLANT
DRSG OPSITE POSTOP 4X8 (GAUZE/BANDAGES/DRESSINGS) IMPLANT
ELECT BLADE 6.5 EXT (BLADE) ×1 IMPLANT
ELECT CAUTERY BLADE 6.4 (BLADE) ×2 IMPLANT
ELECT REM PT RETURN 9FT ADLT (ELECTROSURGICAL) ×2
ELECTRODE REM PT RTRN 9FT ADLT (ELECTROSURGICAL) ×1 IMPLANT
EVACUATOR SILICONE 100CC (DRAIN) ×1 IMPLANT
GAUZE SPONGE 4X4 12PLY STRL (GAUZE/BANDAGES/DRESSINGS) ×2 IMPLANT
GLOVE BIO SURGEON STRL SZ7.5 (GLOVE) ×3 IMPLANT
GLOVE BIOGEL PI IND STRL 8 (GLOVE) ×1 IMPLANT
GLOVE BIOGEL PI INDICATOR 8 (GLOVE) ×1
GOWN STRL REUS W/ TWL LRG LVL3 (GOWN DISPOSABLE) ×1 IMPLANT
GOWN STRL REUS W/ TWL XL LVL3 (GOWN DISPOSABLE) ×1 IMPLANT
GOWN STRL REUS W/TWL LRG LVL3 (GOWN DISPOSABLE) ×2
GOWN STRL REUS W/TWL XL LVL3 (GOWN DISPOSABLE) ×2
HANDLE SUCTION POOLE (INSTRUMENTS) ×1 IMPLANT
KIT BASIN OR (CUSTOM PROCEDURE TRAY) ×2 IMPLANT
KIT TURNOVER KIT B (KITS) ×2 IMPLANT
LIGASURE IMPACT 36 18CM CVD LR (INSTRUMENTS) ×1 IMPLANT
NS IRRIG 1000ML POUR BTL (IV SOLUTION) ×4 IMPLANT
PACK GENERAL/GYN (CUSTOM PROCEDURE TRAY) ×2 IMPLANT
PAD ABD 8X10 STRL (GAUZE/BANDAGES/DRESSINGS) ×2 IMPLANT
PAD ARMBOARD 7.5X6 YLW CONV (MISCELLANEOUS) ×2 IMPLANT
PENCIL SMOKE EVACUATOR (MISCELLANEOUS) ×2 IMPLANT
RELOAD PROXIMATE 75MM BLUE (ENDOMECHANICALS) ×6 IMPLANT
RELOAD STAPLE 75 3.8 BLU REG (ENDOMECHANICALS) IMPLANT
SPECIMEN JAR LARGE (MISCELLANEOUS) IMPLANT
SPONGE LAP 18X18 RF (DISPOSABLE) ×1 IMPLANT
STAPLER PROXIMATE 75MM BLUE (STAPLE) ×1 IMPLANT
STAPLER VISISTAT 35W (STAPLE) ×3 IMPLANT
SUCTION POOLE HANDLE (INSTRUMENTS) ×2
SURGILUBE 2OZ TUBE FLIPTOP (MISCELLANEOUS) ×1 IMPLANT
SUT ETHILON 2 0 FS 18 (SUTURE) ×1 IMPLANT
SUT PDS AB 1 TP1 54 (SUTURE) ×2 IMPLANT
SUT PDS AB 2-0 CT1 27 (SUTURE) ×2 IMPLANT
SUT SILK 2 0 SH CR/8 (SUTURE) ×2 IMPLANT
SUT SILK 2 0 TIES 10X30 (SUTURE) ×2 IMPLANT
SUT SILK 3 0 SH CR/8 (SUTURE) ×2 IMPLANT
SUT SILK 3 0 TIES 10X30 (SUTURE) ×2 IMPLANT
SUT VIC AB 0 CT1 27 (SUTURE) ×8
SUT VIC AB 0 CT1 27XBRD ANBCTR (SUTURE) IMPLANT
SUT VIC AB 2-0 SH 18 (SUTURE) ×3 IMPLANT
SUT VIC AB 3-0 SH 8-18 (SUTURE) ×1 IMPLANT
SYR CONTROL 10ML LL (SYRINGE) ×1 IMPLANT
SYR TOOMEY IRRIG 70ML (MISCELLANEOUS) ×2
SYRINGE TOOMEY IRRIG 70ML (MISCELLANEOUS) IMPLANT
TAPE CLOTH SURG 6X10 WHT LF (GAUZE/BANDAGES/DRESSINGS) ×1 IMPLANT
TOWEL GREEN STERILE (TOWEL DISPOSABLE) ×2 IMPLANT
YANKAUER SUCT BULB TIP NO VENT (SUCTIONS) ×2 IMPLANT
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
## 2020-02-25 NOTE — Progress Notes (Signed)
Orthopedic Tech Progress Note Patient Details:  Kyle [name] [birth year] [id number] Level 1 Trauma  Patient ID: Kyle [name], male   DOB: Aug 18, 1985, 35 y.o.   MRN: [id number]   [name] 02/25/2020, 2:55 AM

## 2020-02-25 NOTE — Progress Notes (Signed)
Patient re-intubated at this time by ED MD. Patient was not properly sedated and self-extubated. Once patient was re-intubated; he was placed back on the same ventilator settings. Post intubated ABG is still needed. Patient currently in OR at this time.

## 2020-02-25 NOTE — Anesthesia Preprocedure Evaluation (Signed)
Anesthesia Evaluation  Patient identified by MRN, date of birth, ID band Patient unresponsive    Reviewed: Patient's Chart, lab work & pertinent test results, Unable to perform ROS - Chart review onlyPreop documentation limited or incomplete due to emergent nature of procedure.  Airway Mallampati: Intubated       Dental   Pulmonary    breath sounds clear to auscultation       Cardiovascular  Rhythm:Regular Rate:Normal     Neuro/Psych    GI/Hepatic   Endo/Other    Renal/GU      Musculoskeletal   Abdominal Three lower abdominal entry wounds  Peds  Hematology   Anesthesia Other Findings   Reproductive/Obstetrics                             Anesthesia Physical Anesthesia Plan  ASA: IV and emergent  Anesthesia Plan: General   Post-op Pain Management:    Induction: Intravenous  PONV Risk Score and Plan: Ondansetron and Dexamethasone  Airway Management Planned: Oral ETT  Additional Equipment: Arterial line, CVP and Ultrasound Guidance Line Placement  Intra-op Plan:   Post-operative Plan: Post-operative intubation/ventilation  Informed Consent: I have reviewed the patients History and Physical, chart, labs and discussed the procedure including the risks, benefits and alternatives for the proposed anesthesia with the patient or authorized representative who has indicated his/her understanding and acceptance.     Dental advisory given  Plan Discussed with: CRNA and Anesthesiologist  Anesthesia Plan Comments:         Anesthesia Quick Evaluation

## 2020-02-25 NOTE — ED Notes (Signed)
Patient transported to CT SCAN . 

## 2020-02-25 NOTE — ED Notes (Signed)
Patient transported to CT SCAN.
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
## 2020-02-25 NOTE — Progress Notes (Signed)
Patient transported to CT and then to OR.

## 2020-02-25 NOTE — Anesthesia Procedure Notes (Signed)
Arterial Line Insertion Performed by: Adair Laundry, CRNA, CRNA  Patient location: Pre-op. Preanesthetic checklist: patient identified, IV checked, site marked, risks and benefits discussed, surgical consent, monitors and equipment checked, pre-op evaluation, timeout performed and anesthesia consent Lidocaine 1% used for infiltration Left, radial was placed Catheter size: 20 Fr Hand hygiene performed  and maximum sterile barriers used   Attempts: 1 Procedure performed without using ultrasound guided technique. Ultrasound Notes:anatomy identified, needle tip was noted to be adjacent to the nerve/plexus identified and no ultrasound evidence of intravascular and/or intraneural injection Following insertion, dressing applied. Post procedure assessment: normal and unchanged  Patient tolerated the procedure well with no immediate complications.

## 2020-02-25 NOTE — Transfer of Care (Signed)
Immediate Anesthesia Transfer of Care Note  Patient: Kyle [name]  Procedure(s) Performed: EXPLORATORY LAPAROTOMY, REPAIR OF COLON (N/A Abdomen) SMALL BOWEL RESECTION (N/A Abdomen) BLADDER REPAIR, CYSTOTOMY CLOSURE (N/A Abdomen)  Patient Location: PACU  Anesthesia Type:General  Level of Consciousness: Patient remains intubated per anesthesia plan  Airway & Oxygen Therapy: Patient remains intubated per anesthesia plan and Patient placed on Ventilator (see vital sign flow sheet for setting)  Post-op Assessment: Report given to RN and Post -op Vital signs reviewed and stable  Post vital signs: Reviewed and stable  Last Vitals:  Vitals Value Taken Time  BP 122/77 02/25/20 0630  Temp    Pulse 82 02/25/20 0642  Resp 16 02/25/20 0642  SpO2 100 % 02/25/20 0642  Vitals shown include unvalidated device data.  Last Pain:  Vitals:   02/25/20 0231  TempSrc: Temporal  PainSc: 0-No pain         Complications: No complications documented.
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
## 2020-02-25 NOTE — Anesthesia Procedure Notes (Signed)
Central Venous Catheter Insertion Performed by: Kipp Brood, MD, anesthesiologist Start/End1/02/2020 4:00 AM, 02/25/2020 4:05 AM Patient location: OR. Preanesthetic checklist: patient identified, IV checked, site marked, risks and benefits discussed, surgical consent, monitors and equipment checked, pre-op evaluation, timeout performed and anesthesia consent Lidocaine 1% used for infiltration and patient sedated Hand hygiene performed  and maximum sterile barriers used  Catheter size: 9 Fr Sheath introducer Procedure performed using ultrasound guided technique. Ultrasound Notes:anatomy identified, needle tip was noted to be adjacent to the nerve/plexus identified, no ultrasound evidence of intravascular and/or intraneural injection and image(s) printed for medical record Attempts: 1 Following insertion, line sutured and dressing applied. Post procedure assessment: blood return through all ports, free fluid flow and no air  Patient tolerated the procedure well with no immediate complications.
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
## 2020-02-25 NOTE — Progress Notes (Signed)
Patient transported from OR to 4N22 without incidence.

## 2020-02-26 ENCOUNTER — Inpatient Hospital Stay (HOSPITAL_COMMUNITY): Payer: Self-pay

## 2020-02-26 LAB — TYPE AND SCREEN
ABO/RH(D): O NEG
Antibody Screen: NEGATIVE
Unit division: 0
Unit division: 0

## 2020-02-26 LAB — BASIC METABOLIC PANEL
Anion gap: 7 (ref 5–15)
BUN: 16 mg/dL (ref 6–20)
CO2: 25 mmol/L (ref 22–32)
Calcium: 7.9 mg/dL — ABNORMAL LOW (ref 8.9–10.3)
Chloride: 110 mmol/L (ref 98–111)
Creatinine, Ser: 0.76 mg/dL (ref 0.61–1.24)
GFR, Estimated: >60 mL/min (ref >60)
Glucose, Bld: 94 mg/dL (ref 70–99)
Potassium: 3.9 mmol/L (ref 3.5–5.1)
Sodium: 142 mmol/L (ref 135–145)

## 2020-02-26 LAB — CBC
HCT: 29.4 % — ABNORMAL LOW (ref 39.0–52.0)
Hemoglobin: 9.6 g/dL — ABNORMAL LOW (ref 13.0–17.0)
MCH: 31.4 pg (ref 26.0–34.0)
MCHC: 32.7 g/dL (ref 30.0–36.0)
MCV: 96.1 fL (ref 80.0–100.0)
Platelets: 109 10*3/uL — ABNORMAL LOW (ref 150–400)
RBC: 3.06 MIL/uL — ABNORMAL LOW (ref 4.22–5.81)
RDW: 14.6 % (ref 11.5–15.5)
WBC: 12.5 10*3/uL — ABNORMAL HIGH (ref 4.0–10.5)
nRBC: 0 % (ref 0.0–0.2)

## 2020-02-26 LAB — BLOOD PRODUCT ORDER (VERBAL) VERIFICATION

## 2020-02-26 LAB — BPAM RBC
Blood Product Expiration Date: 202201162359
Blood Product Expiration Date: 202201162359
ISSUE DATE / TIME: 202201010436
ISSUE DATE / TIME: 202201010436
Unit Type and Rh: 5100
Unit Type and Rh: 5100

## 2020-02-26 LAB — TRIGLYCERIDES: Triglycerides: 177 mg/dL — ABNORMAL HIGH (ref <150)

## 2020-02-26 LAB — PROTIME-INR
INR: 1.6 — ABNORMAL HIGH (ref 0.8–1.2)
Prothrombin Time: 18.9 seconds — ABNORMAL HIGH (ref 11.4–15.2)

## 2020-02-26 MED ORDER — FOLIC ACID 1 MG PO TABS
1.0000 mg | ORAL_TABLET | Freq: Every day | ORAL | Status: DC
  Administered 2020-02-26 – 2020-03-07 (×11): 1 mg via ORAL
  Filled 2020-02-26 (×11): qty 1

## 2020-02-26 MED ORDER — GABAPENTIN 300 MG PO CAPS
300.0000 mg | ORAL_CAPSULE | Freq: Three times a day (TID) | ORAL | Status: DC
Start: 2020-02-26 — End: 2020-03-07
  Administered 2020-02-26 – 2020-03-07 (×32): 300 mg via ORAL
  Filled 2020-02-26 (×30): qty 1

## 2020-02-26 MED ORDER — THIAMINE HCL 100 MG PO TABS
100.0000 mg | ORAL_TABLET | Freq: Every day | ORAL | Status: DC
  Administered 2020-02-26 – 2020-03-07 (×11): 100 mg via ORAL
  Filled 2020-02-26 (×11): qty 1

## 2020-02-26 MED ORDER — OXYCODONE HCL 5 MG PO TABS
10.0000 mg | ORAL_TABLET | ORAL | Status: DC | PRN
  Administered 2020-02-26 – 2020-03-07 (×45): 10 mg via ORAL
  Filled 2020-02-26 (×46): qty 2

## 2020-02-26 MED ORDER — ORAL CARE MOUTH RINSE
15.0000 mL | Freq: Two times a day (BID) | OROMUCOSAL | Status: DC
  Administered 2020-02-26 – 2020-03-06 (×14): 15 mL via OROMUCOSAL

## 2020-02-26 MED ORDER — DEXMEDETOMIDINE HCL IN NACL 400 MCG/100ML IV SOLN
0.2000 ug/kg/h | INTRAVENOUS | Status: DC
  Administered 2020-02-26: 0.4 ug/kg/h via INTRAVENOUS
  Administered 2020-02-26: 0.7 ug/kg/h via INTRAVENOUS
  Filled 2020-02-26: qty 100

## 2020-02-26 MED ORDER — GABAPENTIN 600 MG PO TABS
300.0000 mg | ORAL_TABLET | Freq: Three times a day (TID) | ORAL | Status: DC
  Filled 2020-02-26: qty 0.5

## 2020-02-26 MED ORDER — ACETAMINOPHEN 325 MG PO TABS
650.0000 mg | ORAL_TABLET | Freq: Four times a day (QID) | ORAL | Status: DC
  Administered 2020-02-26 – 2020-03-07 (×42): 650 mg via ORAL
  Filled 2020-02-26 (×42): qty 2

## 2020-02-26 MED ORDER — PANTOPRAZOLE SODIUM 40 MG PO TBEC
40.0000 mg | DELAYED_RELEASE_TABLET | Freq: Every day | ORAL | Status: DC
  Administered 2020-02-26 – 2020-03-07 (×11): 40 mg via ORAL
  Filled 2020-02-26 (×11): qty 1

## 2020-02-26 MED ORDER — MIDAZOLAM HCL 2 MG/2ML IJ SOLN
1.0000 mg | INTRAMUSCULAR | Status: DC | PRN
Start: 2020-02-26 — End: 2020-03-07
  Administered 2020-02-27: 2 mg via INTRAVENOUS
  Filled 2020-02-26: qty 2

## 2020-02-26 MED ORDER — OXYCODONE HCL 5 MG PO TABS
5.0000 mg | ORAL_TABLET | ORAL | Status: DC | PRN
  Filled 2020-02-26: qty 1

## 2020-02-26 MED ORDER — HYDROMORPHONE HCL 1 MG/ML IJ SOLN
0.5000 mg | INTRAMUSCULAR | Status: DC | PRN
  Administered 2020-02-26 – 2020-02-28 (×8): 0.5 mg via INTRAVENOUS
  Filled 2020-02-26 (×8): qty 1

## 2020-02-26 MED ORDER — ADULT MULTIVITAMIN W/MINERALS CH
1.0000 | ORAL_TABLET | Freq: Every day | ORAL | Status: DC
  Administered 2020-02-26 – 2020-03-07 (×11): 1 via ORAL
  Filled 2020-02-26 (×11): qty 1
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
## 2020-02-26 NOTE — Procedures (Signed)
Extubation Procedure Note  Patient Details:   Name: Kyle [name] DOB: 12-04-1985 MRN: [id number]   Airway Documentation:    Vent end date: (not recorded) Vent end time: (not recorded)   Evaluation  O2 sats: stable throughout Complications: No apparent complications Patient did tolerate procedure well. Bilateral Breath Sounds: Clear,Diminished   Yes, patient able to speak.  Patient extubated at 1040 per MD order. Patient placed on 4L nasal cannula. Vitals stable.  Farris Has 02/26/2020, 10:47 AM
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
## 2020-02-26 NOTE — Anesthesia Postprocedure Evaluation (Signed)
Anesthesia Post Note  Patient: Kyle [name]  Procedure(s) Performed: EXPLORATORY LAPAROTOMY, REPAIR OF COLON (N/A Abdomen) SMALL BOWEL RESECTION (N/A Abdomen) BLADDER REPAIR, CYSTOTOMY CLOSURE (N/A Abdomen)     Patient location during evaluation: NICU Anesthesia Type: General Level of consciousness: sedated and patient remains intubated per anesthesia plan Pain management: pain level controlled Vital Signs Assessment: post-procedure vital signs reviewed and stable Respiratory status: patient remains intubated per anesthesia plan and patient on ventilator - see flowsheet for VS Cardiovascular status: stable Postop Assessment: no apparent nausea or vomiting Anesthetic complications: no   No complications documented.  Last Vitals:  Vitals:   02/26/20 1518 02/26/20 1600  BP:  (!) 89/54  Pulse:  61  Resp:  13  Temp: 36.7 C   SpO2:  100%    Last Pain:  Vitals:   02/26/20 1600  TempSrc:   PainSc: 7                  Andie Mortimer COKER
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
## 2020-02-27 ENCOUNTER — Encounter (HOSPITAL_COMMUNITY): Payer: Self-pay | Admitting: Surgery

## 2020-02-27 DIAGNOSIS — R4589 Other symptoms and signs involving emotional state: Secondary | ICD-10-CM

## 2020-02-27 LAB — CBC
HCT: 21.9 % — ABNORMAL LOW (ref 39.0–52.0)
Hemoglobin: 7.5 g/dL — ABNORMAL LOW (ref 13.0–17.0)
MCH: 32.3 pg (ref 26.0–34.0)
MCHC: 34.2 g/dL (ref 30.0–36.0)
MCV: 94.4 fL (ref 80.0–100.0)
Platelets: 105 10*3/uL — ABNORMAL LOW (ref 150–400)
RBC: 2.32 MIL/uL — ABNORMAL LOW (ref 4.22–5.81)
RDW: 14 % (ref 11.5–15.5)
WBC: 10.2 10*3/uL (ref 4.0–10.5)
nRBC: 0.2 % (ref 0.0–0.2)

## 2020-02-27 LAB — BASIC METABOLIC PANEL
Anion gap: 5 (ref 5–15)
BUN: 14 mg/dL (ref 6–20)
CO2: 23 mmol/L (ref 22–32)
Calcium: 7.8 mg/dL — ABNORMAL LOW (ref 8.9–10.3)
Chloride: 103 mmol/L (ref 98–111)
Creatinine, Ser: 0.7 mg/dL (ref 0.61–1.24)
GFR, Estimated: >60 mL/min (ref >60)
Glucose, Bld: 83 mg/dL (ref 70–99)
Potassium: 3.5 mmol/L (ref 3.5–5.1)
Sodium: 131 mmol/L — ABNORMAL LOW (ref 135–145)

## 2020-02-27 LAB — CREATININE, FLUID (PLEURAL, PERITONEAL, JP DRAINAGE): Creat, Fluid: 0.7 mg/dL

## 2020-02-27 LAB — RAPID URINE DRUG SCREEN, HOSP PERFORMED
Amphetamines: NOT DETECTED
Barbiturates: NOT DETECTED
Benzodiazepines: NOT DETECTED
Cocaine: NOT DETECTED
Opiates: POSITIVE — AB
Tetrahydrocannabinol: NOT DETECTED

## 2020-02-27 MED ORDER — ARIPIPRAZOLE 2 MG PO TABS
5.0000 mg | ORAL_TABLET | Freq: Every day | ORAL | Status: DC
Start: 2020-02-28 — End: 2020-03-07
  Administered 2020-02-28 – 2020-03-07 (×9): 5 mg via ORAL
  Filled 2020-02-27 (×10): qty 3

## 2020-02-27 MED ORDER — ESCITALOPRAM OXALATE 10 MG PO TABS
5.0000 mg | ORAL_TABLET | Freq: Every day | ORAL | Status: DC
  Administered 2020-02-27 – 2020-02-29 (×3): 5 mg via ORAL
  Filled 2020-02-27 (×3): qty 1

## 2020-02-27 MED ORDER — ARIPIPRAZOLE 2 MG PO TABS
2.0000 mg | ORAL_TABLET | Freq: Once | ORAL | Status: AC
  Administered 2020-02-27: 2 mg via ORAL
  Filled 2020-02-27 (×2): qty 1

## 2020-02-27 MED ORDER — SODIUM BICARBONATE 8.4 % IV SOLN
INTRAVENOUS | Status: DC | PRN
  Administered 2020-02-27 (×4): 50 meq via INTRAVENOUS

## 2020-02-27 NOTE — Addendum Note (Signed)
Addendum  created 02/27/20 0911 by Adair Laundry, CRNA   Intraprocedure Meds edited
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
## 2020-02-27 NOTE — Progress Notes (Signed)
Clarified with Ron, [facility] that we do not need a suicidal sitter as long as there is a Emergency planning/management officer sitting at the bedside.  The patient is currently in police custody.    RN also clarified with the police that we can only share minimal information with pt's mother.  RN just let her know that the patient is stable when the mother tried to call.

## 2020-02-28 ENCOUNTER — Inpatient Hospital Stay (HOSPITAL_COMMUNITY): Payer: Self-pay

## 2020-02-28 LAB — CBC
HCT: 26.1 % — ABNORMAL LOW (ref 39.0–52.0)
Hemoglobin: 8.8 g/dL — ABNORMAL LOW (ref 13.0–17.0)
MCH: 31.2 pg (ref 26.0–34.0)
MCHC: 33.7 g/dL (ref 30.0–36.0)
MCV: 92.6 fL (ref 80.0–100.0)
Platelets: UNDETERMINED 10*3/uL (ref 150–400)
RBC: 2.82 MIL/uL — ABNORMAL LOW (ref 4.22–5.81)
RDW: 13.2 % (ref 11.5–15.5)
WBC: 14.3 10*3/uL — ABNORMAL HIGH (ref 4.0–10.5)
nRBC: 0 % (ref 0.0–0.2)

## 2020-02-28 LAB — BASIC METABOLIC PANEL
Anion gap: 10 (ref 5–15)
BUN: 11 mg/dL (ref 6–20)
CO2: 23 mmol/L (ref 22–32)
Calcium: 8.2 mg/dL — ABNORMAL LOW (ref 8.9–10.3)
Chloride: 99 mmol/L (ref 98–111)
Creatinine, Ser: 0.61 mg/dL (ref 0.61–1.24)
GFR, Estimated: >60 mL/min (ref >60)
Glucose, Bld: 104 mg/dL — ABNORMAL HIGH (ref 70–99)
Potassium: 3.4 mmol/L — ABNORMAL LOW (ref 3.5–5.1)
Sodium: 132 mmol/L — ABNORMAL LOW (ref 135–145)

## 2020-02-28 LAB — MAGNESIUM: Magnesium: 1.7 mg/dL (ref 1.7–2.4)

## 2020-02-28 LAB — SURGICAL PATHOLOGY

## 2020-02-28 MED ORDER — MAGNESIUM SULFATE IN D5W 1-5 GM/100ML-% IV SOLN
1.0000 g | Freq: Once | INTRAVENOUS | Status: AC
  Administered 2020-02-28: 1 g via INTRAVENOUS
  Filled 2020-02-28 (×2): qty 100

## 2020-02-28 MED ORDER — POTASSIUM CHLORIDE 10 MEQ/100ML IV SOLN
10.0000 meq | INTRAVENOUS | Status: AC
  Administered 2020-02-28 (×4): 10 meq via INTRAVENOUS
  Filled 2020-02-28 (×5): qty 100

## 2020-02-28 MED ORDER — CHLORHEXIDINE GLUCONATE 0.12 % MT SOLN
OROMUCOSAL | Status: AC
  Filled 2020-02-28: qty 15

## 2020-02-28 MED ORDER — MAGNESIUM SULFATE 50 % IJ SOLN: 1.0000 g | Freq: Once | INTRAMUSCULAR | Status: DC
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
## 2020-02-28 NOTE — Progress Notes (Signed)
Patient refused the NG tube.  He wants to wait until later to see how he feels.

## 2020-02-28 NOTE — Plan of Care (Signed)

## 2020-02-28 NOTE — TOC CAGE-AID Note (Signed)
Transition of Care [facility]) - CAGE-AID Screening   Patient Details  Name: Kyle [name] MRN: [id number] Date of Birth: 1985-11-30   Clinical Narrative: Patient endorses alcohol and drug use, states he does not wish to cut down on use. Education offered and accepted.   CAGE-AID Screening:    Have You Ever Felt You Ought to Cut Down on Your Drinking or Drug Use?: Yes Have People Annoyed You By Critizing Your Drinking Or Drug Use?: Yes Have You Felt Bad Or Guilty About Your Drinking Or Drug Use?: Yes Have You Ever Had a Drink or Used Drugs First Thing In The Morning to Steady Your Nerves or to Get Rid of a Hangover?: Yes CAGE-AID Score: 4  Substance Abuse Education Offered: Yes  Substance abuse interventions: Transport planner

## 2020-02-29 DIAGNOSIS — S52514A Nondisplaced fracture of right radial styloid process, initial encounter for closed fracture: Secondary | ICD-10-CM

## 2020-02-29 DIAGNOSIS — W3400XA Accidental discharge from unspecified firearms or gun, initial encounter: Secondary | ICD-10-CM

## 2020-02-29 LAB — CBC
HCT: 27.3 % — ABNORMAL LOW (ref 39.0–52.0)
Hemoglobin: 9.3 g/dL — ABNORMAL LOW (ref 13.0–17.0)
MCH: 31.1 pg (ref 26.0–34.0)
MCHC: 34.1 g/dL (ref 30.0–36.0)
MCV: 91.3 fL (ref 80.0–100.0)
Platelets: 161 10*3/uL (ref 150–400)
RBC: 2.99 MIL/uL — ABNORMAL LOW (ref 4.22–5.81)
RDW: 13 % (ref 11.5–15.5)
WBC: 13 10*3/uL — ABNORMAL HIGH (ref 4.0–10.5)
nRBC: 0 % (ref 0.0–0.2)

## 2020-02-29 LAB — BASIC METABOLIC PANEL
Anion gap: 9 (ref 5–15)
BUN: 11 mg/dL (ref 6–20)
CO2: 24 mmol/L (ref 22–32)
Calcium: 8 mg/dL — ABNORMAL LOW (ref 8.9–10.3)
Chloride: 98 mmol/L (ref 98–111)
Creatinine, Ser: 0.61 mg/dL (ref 0.61–1.24)
GFR, Estimated: >60 mL/min (ref >60)
Glucose, Bld: 103 mg/dL — ABNORMAL HIGH (ref 70–99)
Potassium: 3.7 mmol/L (ref 3.5–5.1)
Sodium: 131 mmol/L — ABNORMAL LOW (ref 135–145)

## 2020-02-29 LAB — MAGNESIUM: Magnesium: 1.9 mg/dL (ref 1.7–2.4)

## 2020-02-29 MED ORDER — ESCITALOPRAM OXALATE 10 MG PO TABS
10.0000 mg | ORAL_TABLET | Freq: Every day | ORAL | Status: DC
  Administered 2020-03-01 – 2020-03-07 (×7): 10 mg via ORAL
  Filled 2020-02-29 (×7): qty 1

## 2020-02-29 MED ORDER — SODIUM CHLORIDE 1 G PO TABS
2.0000 g | ORAL_TABLET | Freq: Once | ORAL | Status: AC
  Administered 2020-02-29: 2 g via ORAL
  Filled 2020-02-29 (×2): qty 2

## 2020-02-29 MED ORDER — PROCHLORPERAZINE EDISYLATE 10 MG/2ML IJ SOLN
10.0000 mg | Freq: Four times a day (QID) | INTRAMUSCULAR | Status: DC | PRN
  Administered 2020-02-29 (×2): 10 mg via INTRAVENOUS
  Filled 2020-02-29 (×2): qty 2
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
## 2020-02-29 NOTE — Progress Notes (Signed)
Patient ID: Kyle [name], male   DOB: [birth year], 36 y.o.   MRN: [id number] Recommended patient participate with physical therapy weightbearing as tolerated through the right wrist and nonweightbearing on the left lower extremity.

## 2020-02-29 NOTE — Progress Notes (Signed)
Pt requested mother come for a one time visit. Department director Gregery Na made aware and coordinated with the police officers/detectives. Law enforcement allowed a one-time, supervised visit in patients hospital room. Assistant director Coralee North informed this RN that I can answer patient's mother's questions as long as patient gives permission to disclose information. Patient's mother is requesting to speak to the MD regarding patient status. MD paged.

## 2020-03-01 ENCOUNTER — Other Ambulatory Visit: Payer: Self-pay

## 2020-03-01 ENCOUNTER — Encounter (HOSPITAL_COMMUNITY): Payer: Self-pay

## 2020-03-01 LAB — CBC
HCT: 29 % — ABNORMAL LOW (ref 39.0–52.0)
Hemoglobin: 9.9 g/dL — ABNORMAL LOW (ref 13.0–17.0)
MCH: 31.1 pg (ref 26.0–34.0)
MCHC: 34.1 g/dL (ref 30.0–36.0)
MCV: 91.2 fL (ref 80.0–100.0)
Platelets: 226 10*3/uL (ref 150–400)
RBC: 3.18 MIL/uL — ABNORMAL LOW (ref 4.22–5.81)
RDW: 12.8 % (ref 11.5–15.5)
WBC: 13 10*3/uL — ABNORMAL HIGH (ref 4.0–10.5)
nRBC: 0 % (ref 0.0–0.2)

## 2020-03-01 LAB — BASIC METABOLIC PANEL
Anion gap: 10 (ref 5–15)
BUN: 15 mg/dL (ref 6–20)
CO2: 25 mmol/L (ref 22–32)
Calcium: 7.7 mg/dL — ABNORMAL LOW (ref 8.9–10.3)
Chloride: 92 mmol/L — ABNORMAL LOW (ref 98–111)
Creatinine, Ser: 0.62 mg/dL (ref 0.61–1.24)
GFR, Estimated: >60 mL/min (ref >60)
Glucose, Bld: 106 mg/dL — ABNORMAL HIGH (ref 70–99)
Potassium: 3.4 mmol/L — ABNORMAL LOW (ref 3.5–5.1)
Sodium: 127 mmol/L — ABNORMAL LOW (ref 135–145)

## 2020-03-01 MED ORDER — SODIUM CHLORIDE 1 G PO TABS
1.0000 g | ORAL_TABLET | Freq: Three times a day (TID) | ORAL | Status: DC
  Administered 2020-03-01 – 2020-03-07 (×20): 1 g via ORAL
  Filled 2020-03-01 (×22): qty 1
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
## 2020-03-01 NOTE — Progress Notes (Signed)
Sheriffs department has given permission for pt's mother to receive medical updates by nurse or MD. Medical updates only, not to mention DC time or any security issues.

## 2020-03-02 LAB — BASIC METABOLIC PANEL
Anion gap: 9 (ref 5–15)
BUN: 13 mg/dL (ref 6–20)
CO2: 28 mmol/L (ref 22–32)
Calcium: 8.3 mg/dL — ABNORMAL LOW (ref 8.9–10.3)
Chloride: 97 mmol/L — ABNORMAL LOW (ref 98–111)
Creatinine, Ser: 0.55 mg/dL — ABNORMAL LOW (ref 0.61–1.24)
GFR, Estimated: >60 mL/min (ref >60)
Glucose, Bld: 106 mg/dL — ABNORMAL HIGH (ref 70–99)
Potassium: 3.4 mmol/L — ABNORMAL LOW (ref 3.5–5.1)
Sodium: 134 mmol/L — ABNORMAL LOW (ref 135–145)

## 2020-03-02 LAB — CBC
HCT: 32.2 % — ABNORMAL LOW (ref 39.0–52.0)
Hemoglobin: 10.7 g/dL — ABNORMAL LOW (ref 13.0–17.0)
MCH: 30.9 pg (ref 26.0–34.0)
MCHC: 33.2 g/dL (ref 30.0–36.0)
MCV: 93.1 fL (ref 80.0–100.0)
Platelets: 234 10*3/uL (ref 150–400)
RBC: 3.46 MIL/uL — ABNORMAL LOW (ref 4.22–5.81)
RDW: 13.2 % (ref 11.5–15.5)
WBC: 12.6 10*3/uL — ABNORMAL HIGH (ref 4.0–10.5)
nRBC: 0 % (ref 0.0–0.2)
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
## 2020-03-02 NOTE — Progress Notes (Signed)
Detective Delford Field from [facility] office would like an update if/ when pt is being discharged or a change in events for pt, for legal reasons. [phone] cell
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
## 2020-03-03 LAB — BASIC METABOLIC PANEL
Anion gap: 9 (ref 5–15)
BUN: 10 mg/dL (ref 6–20)
CO2: 29 mmol/L (ref 22–32)
Calcium: 7.9 mg/dL — ABNORMAL LOW (ref 8.9–10.3)
Chloride: 95 mmol/L — ABNORMAL LOW (ref 98–111)
Creatinine, Ser: 0.58 mg/dL — ABNORMAL LOW (ref 0.61–1.24)
GFR, Estimated: >60 mL/min (ref >60)
Glucose, Bld: 104 mg/dL — ABNORMAL HIGH (ref 70–99)
Potassium: 3.5 mmol/L (ref 3.5–5.1)
Sodium: 133 mmol/L — ABNORMAL LOW (ref 135–145)

## 2020-03-03 LAB — CBC
HCT: 31.4 % — ABNORMAL LOW (ref 39.0–52.0)
Hemoglobin: 10.4 g/dL — ABNORMAL LOW (ref 13.0–17.0)
MCH: 31 pg (ref 26.0–34.0)
MCHC: 33.1 g/dL (ref 30.0–36.0)
MCV: 93.5 fL (ref 80.0–100.0)
Platelets: 347 10*3/uL (ref 150–400)
RBC: 3.36 MIL/uL — ABNORMAL LOW (ref 4.22–5.81)
RDW: 13.4 % (ref 11.5–15.5)
WBC: 12.9 10*3/uL — ABNORMAL HIGH (ref 4.0–10.5)
nRBC: 0 % (ref 0.0–0.2)
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
## 2020-03-04 LAB — BASIC METABOLIC PANEL
Anion gap: 9 (ref 5–15)
BUN: 8 mg/dL (ref 6–20)
CO2: 26 mmol/L (ref 22–32)
Calcium: 7.9 mg/dL — ABNORMAL LOW (ref 8.9–10.3)
Chloride: 98 mmol/L (ref 98–111)
Creatinine, Ser: 0.61 mg/dL (ref 0.61–1.24)
GFR, Estimated: >60 mL/min (ref >60)
Glucose, Bld: 101 mg/dL — ABNORMAL HIGH (ref 70–99)
Potassium: 3.4 mmol/L — ABNORMAL LOW (ref 3.5–5.1)
Sodium: 133 mmol/L — ABNORMAL LOW (ref 135–145)

## 2020-03-04 LAB — CBC
HCT: 29.3 % — ABNORMAL LOW (ref 39.0–52.0)
Hemoglobin: 9.6 g/dL — ABNORMAL LOW (ref 13.0–17.0)
MCH: 30.9 pg (ref 26.0–34.0)
MCHC: 32.8 g/dL (ref 30.0–36.0)
MCV: 94.2 fL (ref 80.0–100.0)
Platelets: 251 10*3/uL (ref 150–400)
RBC: 3.11 MIL/uL — ABNORMAL LOW (ref 4.22–5.81)
RDW: 13.6 % (ref 11.5–15.5)
WBC: 12 10*3/uL — ABNORMAL HIGH (ref 4.0–10.5)
nRBC: 0 % (ref 0.0–0.2)

## 2020-03-04 LAB — CREATININE, FLUID (PLEURAL, PERITONEAL, JP DRAINAGE): Creat, Fluid: 0.6 mg/dL
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
## 2020-03-05 LAB — BASIC METABOLIC PANEL
Anion gap: 8 (ref 5–15)
BUN: 7 mg/dL (ref 6–20)
CO2: 30 mmol/L (ref 22–32)
Calcium: 7.9 mg/dL — ABNORMAL LOW (ref 8.9–10.3)
Chloride: 97 mmol/L — ABNORMAL LOW (ref 98–111)
Creatinine, Ser: 0.61 mg/dL (ref 0.61–1.24)
GFR, Estimated: >60 mL/min (ref >60)
Glucose, Bld: 111 mg/dL — ABNORMAL HIGH (ref 70–99)
Potassium: 3.6 mmol/L (ref 3.5–5.1)
Sodium: 135 mmol/L (ref 135–145)

## 2020-03-05 LAB — CBC
HCT: 28 % — ABNORMAL LOW (ref 39.0–52.0)
Hemoglobin: 9.6 g/dL — ABNORMAL LOW (ref 13.0–17.0)
MCH: 32 pg (ref 26.0–34.0)
MCHC: 34.3 g/dL (ref 30.0–36.0)
MCV: 93.3 fL (ref 80.0–100.0)
Platelets: 299 10*3/uL (ref 150–400)
RBC: 3 MIL/uL — ABNORMAL LOW (ref 4.22–5.81)
RDW: 13.6 % (ref 11.5–15.5)
WBC: 10.4 10*3/uL (ref 4.0–10.5)
nRBC: 0 % (ref 0.0–0.2)
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
## 2020-03-05 NOTE — Progress Notes (Signed)
Frequent emptying noted on JP drain, requiring every hour emptying, Jp re hook to wall suction . Will inform PA.

## 2020-03-06 ENCOUNTER — Inpatient Hospital Stay (HOSPITAL_COMMUNITY): Payer: Self-pay

## 2020-03-06 LAB — BASIC METABOLIC PANEL
Anion gap: 8 (ref 5–15)
BUN: 5 mg/dL — ABNORMAL LOW (ref 6–20)
CO2: 27 mmol/L (ref 22–32)
Calcium: 8 mg/dL — ABNORMAL LOW (ref 8.9–10.3)
Chloride: 101 mmol/L (ref 98–111)
Creatinine, Ser: 0.62 mg/dL (ref 0.61–1.24)
GFR, Estimated: >60 mL/min (ref >60)
Glucose, Bld: 91 mg/dL (ref 70–99)
Potassium: 3.6 mmol/L (ref 3.5–5.1)
Sodium: 136 mmol/L (ref 135–145)

## 2020-03-06 LAB — CBC
HCT: 29.1 % — ABNORMAL LOW (ref 39.0–52.0)
Hemoglobin: 9.5 g/dL — ABNORMAL LOW (ref 13.0–17.0)
MCH: 30.8 pg (ref 26.0–34.0)
MCHC: 32.6 g/dL (ref 30.0–36.0)
MCV: 94.5 fL (ref 80.0–100.0)
Platelets: 327 10*3/uL (ref 150–400)
RBC: 3.08 MIL/uL — ABNORMAL LOW (ref 4.22–5.81)
RDW: 13.7 % (ref 11.5–15.5)
WBC: 11.6 10*3/uL — ABNORMAL HIGH (ref 4.0–10.5)
nRBC: 0 % (ref 0.0–0.2)

## 2020-03-06 MED ORDER — IOHEXOL 300 MG/ML  SOLN
100.0000 mL | Freq: Once | INTRAMUSCULAR | Status: AC | PRN
  Administered 2020-03-06: 100 mL via INTRAVENOUS

## 2020-03-06 MED ORDER — HYDROMORPHONE HCL 1 MG/ML IJ SOLN
0.5000 mg | Freq: Three times a day (TID) | INTRAMUSCULAR | Status: DC | PRN
  Administered 2020-03-07: 0.5 mg via INTRAVENOUS
  Filled 2020-03-06: qty 1
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
## 2020-03-07 LAB — CBC
HCT: 31.5 % — ABNORMAL LOW (ref 39.0–52.0)
Hemoglobin: 10.3 g/dL — ABNORMAL LOW (ref 13.0–17.0)
MCH: 31.1 pg (ref 26.0–34.0)
MCHC: 32.7 g/dL (ref 30.0–36.0)
MCV: 95.2 fL (ref 80.0–100.0)
Platelets: 351 10*3/uL (ref 150–400)
RBC: 3.31 MIL/uL — ABNORMAL LOW (ref 4.22–5.81)
RDW: 13.6 % (ref 11.5–15.5)
WBC: 14 10*3/uL — ABNORMAL HIGH (ref 4.0–10.5)
nRBC: 0 % (ref 0.0–0.2)

## 2020-03-07 LAB — BASIC METABOLIC PANEL
Anion gap: 8 (ref 5–15)
BUN: 5 mg/dL — ABNORMAL LOW (ref 6–20)
CO2: 30 mmol/L (ref 22–32)
Calcium: 8.3 mg/dL — ABNORMAL LOW (ref 8.9–10.3)
Chloride: 98 mmol/L (ref 98–111)
Creatinine, Ser: 0.66 mg/dL (ref 0.61–1.24)
GFR, Estimated: >60 mL/min (ref >60)
Glucose, Bld: 96 mg/dL (ref 70–99)
Potassium: 3.8 mmol/L (ref 3.5–5.1)
Sodium: 136 mmol/L (ref 135–145)

## 2020-03-07 MED ORDER — ADULT MULTIVITAMIN W/MINERALS CH: 1.0000 | ORAL_TABLET | Freq: Every day | ORAL | Status: DC

## 2020-03-07 MED ORDER — OXYCODONE HCL 10 MG PO TABS: 10.0000 mg | ORAL_TABLET | Freq: Four times a day (QID) | ORAL | 0 refills | Status: DC | PRN

## 2020-03-07 MED ORDER — ESCITALOPRAM OXALATE 10 MG PO TABS: 10.0000 mg | ORAL_TABLET | Freq: Every day | ORAL | Status: DC

## 2020-03-07 MED ORDER — THIAMINE HCL 100 MG PO TABS: 100.0000 mg | ORAL_TABLET | Freq: Every day | ORAL | Status: DC

## 2020-03-07 MED ORDER — GABAPENTIN 300 MG PO CAPS: 300.0000 mg | ORAL_CAPSULE | Freq: Three times a day (TID) | ORAL | Status: DC

## 2020-03-07 MED ORDER — ACETAMINOPHEN 325 MG PO TABS: 650.0000 mg | ORAL_TABLET | Freq: Four times a day (QID) | ORAL | Status: AC | PRN

## 2020-03-07 MED ORDER — PANTOPRAZOLE SODIUM 40 MG PO TBEC: 40.0000 mg | DELAYED_RELEASE_TABLET | Freq: Every day | ORAL | Status: DC

## 2020-03-07 MED ORDER — FOLIC ACID 1 MG PO TABS: 1.0000 mg | ORAL_TABLET | Freq: Every day | ORAL | Status: DC

## 2020-03-07 MED ORDER — SODIUM CHLORIDE 1 G PO TABS: 1.0000 g | ORAL_TABLET | Freq: Three times a day (TID) | ORAL | Status: DC

## 2020-03-07 MED ORDER — ARIPIPRAZOLE 2 MG PO TABS: 5.0000 mg | ORAL_TABLET | Freq: Every day | ORAL | Status: DC
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
## 2020-03-07 NOTE — Progress Notes (Signed)
[facility]

## 2020-03-20 ENCOUNTER — Ambulatory Visit (INDEPENDENT_AMBULATORY_CARE_PROVIDER_SITE_OTHER): Payer: Self-pay | Admitting: Orthopedic Surgery

## 2020-03-20 ENCOUNTER — Ambulatory Visit (INDEPENDENT_AMBULATORY_CARE_PROVIDER_SITE_OTHER): Payer: Self-pay

## 2020-03-20 DIAGNOSIS — M25531 Pain in right wrist: Secondary | ICD-10-CM

## 2020-03-20 DIAGNOSIS — S71132D Puncture wound without foreign body, left thigh, subsequent encounter: Secondary | ICD-10-CM

## 2020-03-21 ENCOUNTER — Encounter: Payer: Self-pay | Admitting: Orthopedic Surgery
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
## 2020-03-27 ENCOUNTER — Encounter (HOSPITAL_COMMUNITY): Payer: Self-pay | Admitting: Emergency Medicine

## 2020-03-27 ENCOUNTER — Emergency Department (HOSPITAL_COMMUNITY)
Admission: EM | Admit: 2020-03-27 | Discharge: 2020-03-27 | Disposition: A | Discharge status: Home / Self Care | Payer: Self-pay | Attending: Emergency Medicine | Admitting: Emergency Medicine

## 2020-03-27 DIAGNOSIS — R1032 Left lower quadrant pain: Secondary | ICD-10-CM | POA: Insufficient documentation

## 2020-03-27 DIAGNOSIS — R112 Nausea with vomiting, unspecified: Secondary | ICD-10-CM | POA: Insufficient documentation

## 2020-03-27 DIAGNOSIS — Z5321 Procedure and treatment not carried out due to patient leaving prior to being seen by health care provider: Secondary | ICD-10-CM | POA: Insufficient documentation

## 2020-03-27 LAB — COMPREHENSIVE METABOLIC PANEL
ALT: 26 U/L (ref 0–44)
AST: 19 U/L (ref 15–41)
Albumin: 3.5 g/dL (ref 3.5–5.0)
Alkaline Phosphatase: 95 U/L (ref 38–126)
Anion gap: 10 (ref 5–15)
BUN: 12 mg/dL (ref 6–20)
CO2: 25 mmol/L (ref 22–32)
Calcium: 9.3 mg/dL (ref 8.9–10.3)
Chloride: 102 mmol/L (ref 98–111)
Creatinine, Ser: 0.6 mg/dL — ABNORMAL LOW (ref 0.61–1.24)
GFR, Estimated: >60 mL/min (ref >60)
Glucose, Bld: 106 mg/dL — ABNORMAL HIGH (ref 70–99)
Potassium: 4.6 mmol/L (ref 3.5–5.1)
Sodium: 137 mmol/L (ref 135–145)
Total Bilirubin: 0.5 mg/dL (ref 0.3–1.2)
Total Protein: 6.9 g/dL (ref 6.5–8.1)

## 2020-03-27 LAB — LIPASE, BLOOD: Lipase: 35 U/L (ref 11–51)

## 2020-03-27 LAB — URINALYSIS, ROUTINE W REFLEX MICROSCOPIC
Bilirubin Urine: NEGATIVE
Glucose, UA: NEGATIVE mg/dL
Hgb urine dipstick: NEGATIVE
Ketones, ur: NEGATIVE mg/dL
Nitrite: POSITIVE — AB
Protein, ur: NEGATIVE mg/dL
Specific Gravity, Urine: 1.019 (ref 1.005–1.030)
WBC, UA: >50 WBC/hpf — ABNORMAL HIGH (ref 0–5)
pH: 5 (ref 5.0–8.0)

## 2020-03-27 LAB — CBC
HCT: 37.8 % — ABNORMAL LOW (ref 39.0–52.0)
Hemoglobin: 12.2 g/dL — ABNORMAL LOW (ref 13.0–17.0)
MCH: 31.1 pg (ref 26.0–34.0)
MCHC: 32.3 g/dL (ref 30.0–36.0)
MCV: 96.4 fL (ref 80.0–100.0)
Platelets: 297 10*3/uL (ref 150–400)
RBC: 3.92 MIL/uL — ABNORMAL LOW (ref 4.22–5.81)
RDW: 14.2 % (ref 11.5–15.5)
WBC: 11.4 10*3/uL — ABNORMAL HIGH (ref 4.0–10.5)
nRBC: 0 % (ref 0.0–0.2)

## 2020-03-27 NOTE — ED Notes (Signed)
Called patient several times patient didn't answer  

## 2020-03-27 NOTE — ED Notes (Signed)
Pt called for recheck of VS no Answer
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
## 2020-04-12 ENCOUNTER — Ambulatory Visit: Payer: Self-pay | Admitting: Orthopedic Surgery

## 2020-04-17 ENCOUNTER — Ambulatory Visit: Payer: Self-pay | Admitting: Orthopedic Surgery

## 2020-04-19 ENCOUNTER — Encounter: Payer: Self-pay | Admitting: Nurse Practitioner

## 2020-04-19 ENCOUNTER — Ambulatory Visit (INDEPENDENT_AMBULATORY_CARE_PROVIDER_SITE_OTHER): Payer: Self-pay | Admitting: Nurse Practitioner

## 2020-04-19 ENCOUNTER — Other Ambulatory Visit: Payer: Self-pay

## 2020-04-19 VITALS — BP 112/61 | HR 81 | Temp 99.4°F | Ht 70.0 in | Wt 137.8 lb

## 2020-04-19 DIAGNOSIS — F419 Anxiety disorder, unspecified: Secondary | ICD-10-CM | POA: Insufficient documentation

## 2020-04-19 DIAGNOSIS — R1030 Lower abdominal pain, unspecified: Secondary | ICD-10-CM | POA: Insufficient documentation

## 2020-04-19 DIAGNOSIS — Z09 Encounter for follow-up examination after completed treatment for conditions other than malignant neoplasm: Secondary | ICD-10-CM | POA: Insufficient documentation

## 2020-04-19 MED ORDER — DICLOFENAC SODIUM 75 MG PO TBEC: 75.0000 mg | DELAYED_RELEASE_TABLET | Freq: Two times a day (BID) | ORAL | 0 refills | Status: DC

## 2020-04-19 MED ORDER — BUSPIRONE HCL 5 MG PO TABS: 5.0000 mg | ORAL_TABLET | Freq: Three times a day (TID) | ORAL | 1 refills | Status: DC
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
## 2020-04-19 NOTE — Assessment & Plan Note (Signed)
Started patient on BuSpar 5 mg tablet for uncontrolled anxiety.  Education provided printed handouts given to patient follow-up in 6 to 12 weeks.  Rx sent to pharmacy.

## 2020-04-19 NOTE — Assessment & Plan Note (Signed)
Pain symptoms not well controlled.  Encourage patient to continue anti-inflammatory, diclofenac 75 mg given follow-up with worsening unresolved symptoms.  RX sent to pharmacy

## 2020-04-19 NOTE — Patient Instructions (Signed)

## 2020-05-08 ENCOUNTER — Telehealth: Payer: Self-pay

## 2020-05-08 ENCOUNTER — Other Ambulatory Visit: Payer: Self-pay | Admitting: Nurse Practitioner

## 2020-05-08 MED ORDER — GABAPENTIN 300 MG PO CAPS: 300.0000 mg | ORAL_CAPSULE | Freq: Three times a day (TID) | ORAL | 2 refills | Status: DC

## 2020-05-08 NOTE — Telephone Encounter (Signed)
  Prescription Request  05/08/2020  What is the name of the medication or equipment? gabapentin (NEURONTIN) 300 MG capsule  Have you contacted your pharmacy to request a refill? (if applicable) no last ov 04/19/2020 with Je  Which pharmacy would you like this sent to? [facility]   Patient notified that their request is being sent to the clinical staff for review and that they should receive a response within 2 business days.

## 2020-05-08 NOTE — Telephone Encounter (Signed)
Patient aware of sent medication

## 2020-05-08 NOTE — Telephone Encounter (Signed)
Gabapentin sent to [facility]

## 2020-05-10 ENCOUNTER — Telehealth: Payer: Self-pay

## 2020-05-10 MED ORDER — GABAPENTIN 300 MG PO CAPS: 300.0000 mg | ORAL_CAPSULE | Freq: Three times a day (TID) | ORAL | 2 refills | Status: DC

## 2020-05-10 NOTE — Telephone Encounter (Signed)
Re-sent to pharm (printed last time)

## 2020-06-01 ENCOUNTER — Ambulatory Visit: Payer: Self-pay | Admitting: Nurse Practitioner

## 2020-06-14 ENCOUNTER — Encounter: Payer: Self-pay | Admitting: Nurse Practitioner

## 2020-11-23 ENCOUNTER — Other Ambulatory Visit: Payer: Self-pay | Admitting: Nurse Practitioner
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
## 2021-07-11 DIAGNOSIS — F1910 Other psychoactive substance abuse, uncomplicated: Secondary | ICD-10-CM | POA: Insufficient documentation

## 2021-07-11 DIAGNOSIS — I4891 Unspecified atrial fibrillation: Secondary | ICD-10-CM | POA: Insufficient documentation

## 2021-07-11 DIAGNOSIS — Z87828 Personal history of other (healed) physical injury and trauma: Secondary | ICD-10-CM | POA: Insufficient documentation

## 2021-07-12 DIAGNOSIS — D72829 Elevated white blood cell count, unspecified: Secondary | ICD-10-CM | POA: Insufficient documentation

## 2021-07-12 DIAGNOSIS — E871 Hypo-osmolality and hyponatremia: Secondary | ICD-10-CM | POA: Insufficient documentation

## 2021-07-12 DIAGNOSIS — J690 Pneumonitis due to inhalation of food and vomit: Secondary | ICD-10-CM | POA: Insufficient documentation

## 2021-07-15 ENCOUNTER — Telehealth: Payer: Self-pay

## 2021-07-16 NOTE — Telephone Encounter (Signed)
Yes

## 2021-07-16 NOTE — Telephone Encounter (Signed)
Transition Care Management Follow-up Telephone Call Date of discharge and from where: [facility] 07/13/21 - A.Fib How have you been since you were released from the hospital? He feels fine now Any questions or concerns? Yes He is taking aspirin 81mg  BID since he wasn't able to get Eliquis - He doesn't have insurance - only [employer]  Items Reviewed: Did the pt receive and understand the discharge instructions provided? Yes  Medications obtained and verified?  He was unable to get Eliquis, but he has other meds that were called in Other? No  Any new allergies since your discharge? No  Dietary orders reviewed? Yes Do you have support at home? Yes   Home Care and Equipment/Supplies: Were home health services ordered? No  Were any new equipment or medical supplies ordered?  No  Functional Questionnaire: (I = Independent and D = Dependent) ADLs: i  Bathing/Dressing- i  Meal Prep- i  Eating- i  Maintaining continence- i  Transferring/Ambulation- i  Managing Meds- i  Follow up appointments reviewed:  PCP Hospital f/u appt confirmed? Yes  Scheduled to see Onyeje on 5/30 @ 8:15. Specialist Hospital f/u appt confirmed? No   Are transportation arrangements needed? No  If their condition worsens, is the pt aware to call PCP or go to the Emergency Dept.? Yes Was the patient provided with contact information for the PCP's office or ED? Yes Was to pt encouraged to call back with questions or concerns? Yes

## 2021-07-23 ENCOUNTER — Encounter: Payer: Self-pay | Admitting: Nurse Practitioner

## 2021-07-23 ENCOUNTER — Ambulatory Visit (INDEPENDENT_AMBULATORY_CARE_PROVIDER_SITE_OTHER): Payer: Self-pay | Admitting: Nurse Practitioner

## 2021-07-23 VITALS — BP 88/40 | HR 63 | Temp 97.7°F | Resp 20 | Ht 70.0 in | Wt 140.0 lb

## 2021-07-23 DIAGNOSIS — I4891 Unspecified atrial fibrillation: Secondary | ICD-10-CM

## 2021-07-23 DIAGNOSIS — Z09 Encounter for follow-up examination after completed treatment for conditions other than malignant neoplasm: Secondary | ICD-10-CM

## 2021-07-23 DIAGNOSIS — F1910 Other psychoactive substance abuse, uncomplicated: Secondary | ICD-10-CM

## 2021-07-23 MED ORDER — TRAZODONE HCL 100 MG PO TABS: 100.0000 mg | ORAL_TABLET | Freq: Every day | ORAL | 2 refills | Status: AC

## 2021-07-23 MED ORDER — GABAPENTIN 300 MG PO CAPS: 300.0000 mg | ORAL_CAPSULE | Freq: Three times a day (TID) | ORAL | 0 refills | Status: DC

## 2021-07-23 NOTE — Patient Instructions (Signed)
Atrial Fibrillation  Atrial fibrillation is a type of heartbeat that is irregular or fast. If you have this condition, your heart beats without any order. This makes it hard for your heart to pump blood in a normal way. Atrial fibrillation may come and go, or it may become a long-lasting problem. If this condition is not treated, it can put you at higher risk for stroke, heart failure, and other heart problems. What are the causes? This condition may be caused by diseases that damage the heart. They include: High blood pressure. Heart failure. Heart valve disease. Heart surgery. Other causes include: Diabetes. Thyroid disease. Being overweight. Kidney disease. Sometimes the cause is not known. What increases the risk? You are more likely to develop this condition if: You are older. You smoke. You exercise often and very hard. You have a family history of this condition. You are a man. You use drugs. You drink a lot of alcohol. You have lung conditions, such as emphysema, pneumonia, or COPD. You have sleep apnea. What are the signs or symptoms? Common symptoms of this condition include: A feeling that your heart is beating very fast. Chest pain or discomfort. Feeling short of breath. Suddenly feeling light-headed or weak. Getting tired easily during activity. Fainting. Sweating. In some cases, there are no symptoms. How is this treated? Treatment for this condition depends on underlying conditions and how you feel when you have atrial fibrillation. They include: Medicines to: Prevent blood clots. Treat heart rate or heart rhythm problems. Using devices, such as a pacemaker, to correct heart rhythm problems. Doing surgery to remove the part of the heart that sends bad signals. Closing an area where clots can form in the heart (left atrial appendage). In some cases, your doctor will treat other underlying conditions. Follow these instructions at home: Medicines Take  over-the-counter and prescription medicines only as told by your doctor. Do not take any new medicines without first talking to your doctor. If you are taking blood thinners: Talk with your doctor before you take any medicines that have aspirin or NSAIDs, such as ibuprofen, in them. Take your medicine exactly as told by your doctor. Take it at the same time each day. Avoid activities that could hurt or bruise you. Follow instructions about how to prevent falls. Wear a bracelet that says you are taking blood thinners. Or, carry a card that lists what medicines you take. Lifestyle     Do not use any products that have nicotine or tobacco in them. These include cigarettes, e-cigarettes, and chewing tobacco. If you need help quitting, ask your doctor. Eat heart-healthy foods. Talk with your doctor about the right eating plan for you. Exercise regularly as told by your doctor. Do not drink alcohol. Lose weight if you are overweight. Do not use drugs, including cannabis. General instructions If you have a condition that causes breathing to stop for a short period of time (apnea), treat it as told by your doctor. Keep a healthy weight. Do not use diet pills unless your doctor says they are safe for you. Diet pills may make heart problems worse. Keep all follow-up visits as told by your doctor. This is important. Contact a doctor if: You notice a change in the speed, rhythm, or strength of your heartbeat. You are taking a blood-thinning medicine and you get more bruising. You get tired more easily when you move or exercise. You have a sudden change in weight. Get help right away if:  You have pain in   your chest or your belly (abdomen). You have trouble breathing. You have side effects of blood thinners, such as blood in your vomit, poop (stool), or pee (urine), or bleeding that cannot stop. You have any signs of a stroke. "BE FAST" is an easy way to remember the main warning signs: B -  Balance. Signs are dizziness, sudden trouble walking, or loss of balance. E - Eyes. Signs are trouble seeing or a change in how you see. F - Face. Signs are sudden weakness or loss of feeling in the face, or the face or eyelid drooping on one side. A - Arms. Signs are weakness or loss of feeling in an arm. This happens suddenly and usually on one side of the body. S - Speech. Signs are sudden trouble speaking, slurred speech, or trouble understanding what people say. T - Time. Time to call emergency services. Write down what time symptoms started. You have other signs of a stroke, such as: A sudden, very bad headache with no known cause. Feeling like you may vomit (nausea). Vomiting. A seizure. These symptoms may be an emergency. Do not wait to see if the symptoms will go away. Get medical help right away. Call your local emergency services (911 in the [place] Do not drive yourself to the hospital. Summary Atrial fibrillation is a type of heartbeat that is irregular or fast. You are at higher risk of this condition if you smoke, are older, have diabetes, or are overweight. Follow your doctor's instructions about medicines, diet, exercise, and follow-up visits. Get help right away if you have signs or symptoms of a stroke. Get help right away if you cannot catch your breath, or you have chest pain or discomfort. This information is not intended to replace advice given to you by your health care provider. Make sure you discuss any questions you have with your health care provider. Document Revised: 08/04/2018 Document Reviewed: 08/04/2018 Elsevier Patient Education  2023 Elsevier Inc.  

## 2021-07-23 NOTE — Assessment & Plan Note (Signed)
Completed hospital follow-up discharge instructions.  Education provided with medication reconciliation.  Patient verbalized understanding.

## 2021-07-23 NOTE — Assessment & Plan Note (Signed)
Patient reports that he has been clean for 2 weeks and is hoping to continue. Resources provided for rehab as needed.
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
## 2021-07-23 NOTE — Assessment & Plan Note (Signed)
Patient following up after A-fib/hospital admission.  Symptoms well controlled with no new signs and symptoms.  Patient unable to afford Eliquis and is currently not taking medication.  I provided education to patient on the importance of taking medication and tried to give him samples.  Clinic is out of samples but patient will be notified as soon as we have some available.  Patient knows to take blood pressure reading daily and follow-up in 2 weeks.  Blood pressure hypotension today with values of 80/40.  Currently on Metroprolol 25 mg tablet daily, losartan 25 mg tablet by mouth daily.  Advised to cut pill in half, increase hydration.

## 2021-08-06 ENCOUNTER — Encounter: Payer: Self-pay | Admitting: Nurse Practitioner

## 2021-08-06 ENCOUNTER — Ambulatory Visit (INDEPENDENT_AMBULATORY_CARE_PROVIDER_SITE_OTHER): Payer: Self-pay | Admitting: Nurse Practitioner

## 2021-08-06 ENCOUNTER — Other Ambulatory Visit: Payer: Self-pay | Admitting: Nurse Practitioner

## 2021-08-06 VITALS — BP 106/60 | HR 66 | Resp 98 | Ht 70.0 in | Wt 135.2 lb

## 2021-08-06 DIAGNOSIS — F419 Anxiety disorder, unspecified: Secondary | ICD-10-CM

## 2021-08-06 MED ORDER — BUSPIRONE HCL 10 MG PO TABS: 10.0000 mg | ORAL_TABLET | Freq: Two times a day (BID) | ORAL | 5 refills | Status: AC

## 2021-08-06 MED ORDER — METOPROLOL SUCCINATE ER 25 MG PO TB24: 25.0000 mg | ORAL_TABLET | Freq: Every day | ORAL | 0 refills | Status: DC

## 2021-08-06 NOTE — Progress Notes (Signed)
Acute Office Visit  Subjective:     Patient ID: Kyle [name], male    DOB: 1985/09/22, 36 y.o.   MRN: [id number]  Chief Complaint  Patient presents with   Hypertension    Pt states he was checking it at home ranging around 116/70     HPI Patient is in today for follow up of hypertension. Patient was diagnosed recently after ED visit for over dose. The patient is tolerating the medication well without side effects. Compliance with treatment has been good; including taking medication as directed , maintains a healthy diet and regular exercise regimen , and following up as directed.  Recently Blood pressure has been low and patient has not had to take medication due to hypotension. He reports he didn't have any blood pressure issues before his recent hospital incidence.    Anxiety, Follow-up  He was last seen for anxiety  months ago. Changes made at last visit include Buspar 10 mg tablet by mouth .   He reports good compliance with treatment. He reports good tolerance of treatment. He is not having side effects.   He feels his anxiety is moderate and Improved since last visit.  Symptoms: No chest pain No difficulty concentrating  No dizziness No fatigue  No feelings of losing control No insomnia  No irritable No palpitations  No panic attacks No racing thoughts  No shortness of breath No sweating  No tremors/shakes    GAD-7 Results    08/06/2021    8:02 AM 07/23/2021    8:12 AM 04/19/2020   10:41 AM  GAD-7 Generalized Anxiety Disorder Screening Tool  1. Feeling Nervous, Anxious, or on Edge 3 3 3   2. Not Being Able to Stop or Control Worrying 0 0 1  3. Worrying Too Much About Different Things 1 0 2  4. Trouble Relaxing 1 0 0  5. Being So Restless it's Hard To Sit Still 1 0 1  6. Becoming Easily Annoyed or Irritable 0 0 0  7. Feeling Afraid As If Something Awful Might Happen 0 0 1  Total GAD-7 Score 6 3 8   Difficulty At Work, Home, or Getting  Along With Others?  Not difficult at all Somewhat difficult Somewhat difficult    PHQ-9 Scores    08/06/2021    8:02 AM 07/23/2021    8:11 AM 04/19/2020   10:41 AM  PHQ9 SCORE ONLY  PHQ-9 Total Score 0 3 14     Review of Systems  Constitutional: Negative.  Negative for chills and fever.  HENT: Negative.    Respiratory: Negative.    Cardiovascular: Negative.   Genitourinary: Negative.   Musculoskeletal: Negative.   Skin: Negative.  Negative for rash.  Neurological: Negative.   All other systems reviewed and are negative.       Objective:    BP 106/60   Pulse 66   Resp (!) 98   Ht 5\' 10"  (1.778 m)   Wt 135 lb 3.2 oz (61.3 kg)   BMI 19.40 kg/m  BP Readings from Last 3 Encounters:  08/06/21 106/60  07/23/21 (!) 88/40  04/19/20 112/61   Wt Readings from Last 3 Encounters:  08/06/21 135 lb 3.2 oz (61.3 kg)  07/23/21 140 lb (63.5 kg)  04/19/20 137 lb 12.8 oz (62.5 kg)      Physical Exam Vitals and nursing note reviewed.  Constitutional:      Appearance: Normal appearance.  HENT:     Head: Normocephalic.  Right Ear: External ear normal.     Left Ear: External ear normal.     Nose: Nose normal.     Mouth/Throat:     Mouth: Mucous membranes are moist.     Pharynx: Oropharynx is clear.  Eyes:     Conjunctiva/sclera: Conjunctivae normal.  Cardiovascular:     Rate and Rhythm: Normal rate and regular rhythm.     Pulses: Normal pulses.     Heart sounds: Normal heart sounds.  Pulmonary:     Effort: Pulmonary effort is normal.     Breath sounds: Normal breath sounds.  Abdominal:     General: Bowel sounds are normal.  Skin:    General: Skin is warm.     Findings: No rash.  Neurological:     General: No focal deficit present.     Mental Status: He is alert and oriented to person, place, and time.  Psychiatric:        Mood and Affect: Mood normal.        Behavior: Behavior normal.     No results found for any visits on 08/06/21.      Assessment & Plan:  Due to  patients ongoing hypotension , I discontinued cozaar and advised patient to monitor blood pressure for one week and only continue metoprolol if blood pressure begins to trend up ward. He is to call in BP values and follow up in 2 weeks. Patient verbalize understanding.   Anxiety well controlled on current medication no changes necessary.  Completed GAD-7.  Refill sent to pharmacy.  Patient knows to follow-up in 3 to 6 months.  Problem List Items Addressed This Visit       Other   Anxiety - Primary   Relevant Medications   busPIRone (BUSPAR) 10 MG tablet    Meds ordered this encounter  Medications   busPIRone (BUSPAR) 10 MG tablet    Sig: Take 1 tablet (10 mg total) by mouth 2 (two) times daily.    Dispense:  30 tablet    Refill:  5    Order Specific Question:   Supervising Provider    Answer:   Standley Brooking   metoprolol succinate (TOPROL-XL) 25 MG 24 hr tablet    Sig: Take 1 tablet (25 mg total) by mouth daily.    Dispense:  30 tablet    Refill:  0    Order Specific Question:   Supervising Provider    Answer:   Mechele Claude [[id number]]    Return in about 6 months (around 02/05/2022) for keep 6 months appointment as scheduled from prior visit.  Daryll Drown, NP

## 2021-08-06 NOTE — Patient Instructions (Signed)
Hypertension, Adult ?Hypertension is another name for high blood pressure. High blood pressure forces your heart to work harder to pump blood. This can cause problems over time. ?There are two numbers in a blood pressure reading. There is a top number (systolic) over a bottom number (diastolic). It is best to have a blood pressure that is below 120/80. ?What are the causes? ?The cause of this condition is not known. Some other conditions can lead to high blood pressure. ?What increases the risk? ?Some lifestyle factors can make you more likely to develop high blood pressure: ?Smoking. ?Not getting enough exercise or physical activity. ?Being overweight. ?Having too much fat, sugar, calories, or salt (sodium) in your diet. ?Drinking too much alcohol. ?Other risk factors include: ?Having any of these conditions: ?Heart disease. ?Diabetes. ?High cholesterol. ?Kidney disease. ?Obstructive sleep apnea. ?Having a family history of high blood pressure and high cholesterol. ?Age. The risk increases with age. ?Stress. ?What are the signs or symptoms? ?High blood pressure may not cause symptoms. Very high blood pressure (hypertensive crisis) may cause: ?Headache. ?Fast or uneven heartbeats (palpitations). ?Shortness of breath. ?Nosebleed. ?Vomiting or feeling like you may vomit (nauseous). ?Changes in how you see. ?Very bad chest pain. ?Feeling dizzy. ?Seizures. ?How is this treated? ?This condition is treated by making healthy lifestyle changes, such as: ?Eating healthy foods. ?Exercising more. ?Drinking less alcohol. ?Your doctor may prescribe medicine if lifestyle changes do not help enough and if: ?Your top number is above 130. ?Your bottom number is above 80. ?Your personal target blood pressure may vary. ?Follow these instructions at home: ?Eating and drinking ? ?If told, follow the DASH eating plan. To follow this plan: ?Fill one half of your plate at each meal with fruits and vegetables. ?Fill one fourth of your plate  at each meal with whole grains. Whole grains include whole-wheat pasta, brown rice, and whole-grain bread. ?Eat or drink low-fat dairy products, such as skim milk or low-fat yogurt. ?Fill one fourth of your plate at each meal with low-fat (lean) proteins. Low-fat proteins include fish, chicken without skin, eggs, beans, and tofu. ?Avoid fatty meat, cured and processed meat, or chicken with skin. ?Avoid pre-made or processed food. ?Limit the amount of salt in your diet to less than 1,500 mg each day. ?Do not drink alcohol if: ?Your doctor tells you not to drink. ?You are pregnant, may be pregnant, or are planning to become pregnant. ?If you drink alcohol: ?Limit how much you have to: ?0-1 drink a day for women. ?0-2 drinks a day for men. ?Know how much alcohol is in your drink. In the [place] one drink equals one 12 oz bottle of beer (355 mL), one 5 oz glass of wine (148 mL), or one 1? oz glass of hard liquor (44 mL). ?Lifestyle ? ?Work with your doctor to stay at a healthy weight or to lose weight. Ask your doctor what the best weight is for you. ?Get at least 30 minutes of exercise that causes your heart to beat faster (aerobic exercise) most days of the week. This may include walking, swimming, or biking. ?Get at least 30 minutes of exercise that strengthens your muscles (resistance exercise) at least 3 days a week. This may include lifting weights or doing Pilates. ?Do not smoke or use any products that contain nicotine or tobacco. If you need help quitting, ask your doctor. ?Check your blood pressure at home as told by your doctor. ?Keep all follow-up visits. ?Medicines ?Take over-the-counter and prescription medicines   only as told by your doctor. Follow directions carefully. ?Do not skip doses of blood pressure medicine. The medicine does not work as well if you skip doses. Skipping doses also puts you at risk for problems. ?Ask your doctor about side effects or reactions to medicines that you should watch  for. ?Contact a doctor if: ?You think you are having a reaction to the medicine you are taking. ?You have headaches that keep coming back. ?You feel dizzy. ?You have swelling in your ankles. ?You have trouble with your vision. ?Get help right away if: ?You get a very bad headache. ?You start to feel mixed up (confused). ?You feel weak or numb. ?You feel faint. ?You have very bad pain in your: ?Chest. ?Belly (abdomen). ?You vomit more than once. ?You have trouble breathing. ?These symptoms may be an emergency. Get help right away. Call 911. ?Do not wait to see if the symptoms will go away. ?Do not drive yourself to the hospital. ?Summary ?Hypertension is another name for high blood pressure. ?High blood pressure forces your heart to work harder to pump blood. ?For most people, a normal blood pressure is less than 120/80. ?Making healthy choices can help lower blood pressure. If your blood pressure does not get lower with healthy choices, you may need to take medicine. ?This information is not intended to replace advice given to you by your health care provider. Make sure you discuss any questions you have with your health care provider. ?Document Revised: 11/29/2020 Document Reviewed: 11/29/2020 ?Elsevier Patient Education ? 2023 Elsevier Inc[employer] ? ?

## 2021-08-07 DIAGNOSIS — I5020 Unspecified systolic (congestive) heart failure: Secondary | ICD-10-CM | POA: Insufficient documentation

## 2021-08-21 ENCOUNTER — Ambulatory Visit (INDEPENDENT_AMBULATORY_CARE_PROVIDER_SITE_OTHER): Payer: Self-pay | Admitting: Family Medicine

## 2021-08-21 ENCOUNTER — Ambulatory Visit (INDEPENDENT_AMBULATORY_CARE_PROVIDER_SITE_OTHER): Payer: Self-pay

## 2021-08-21 ENCOUNTER — Encounter: Payer: Self-pay | Admitting: Family Medicine

## 2021-08-21 VITALS — BP 110/63 | HR 69 | Temp 98.9°F | Ht 70.0 in | Wt 135.0 lb

## 2021-08-21 DIAGNOSIS — S6991XA Unspecified injury of right wrist, hand and finger(s), initial encounter: Secondary | ICD-10-CM

## 2021-08-21 DIAGNOSIS — Z2821 Immunization not carried out because of patient refusal: Secondary | ICD-10-CM

## 2021-08-21 DIAGNOSIS — L03011 Cellulitis of right finger: Secondary | ICD-10-CM

## 2021-08-21 MED ORDER — SULFAMETHOXAZOLE-TRIMETHOPRIM 800-160 MG PO TABS: 1.0000 | ORAL_TABLET | Freq: Two times a day (BID) | ORAL | 0 refills | Status: AC

## 2021-08-21 NOTE — Progress Notes (Signed)
Subjective:  Patient ID: Kyle [name], male    DOB: October 22, 1985, 36 y.o.   MRN: [id number]  Patient Care Team: Daryll Drown, NP as PCP - General (Nurse Practitioner) Elfredia Nevins, MD (Internal Medicine)   Chief Complaint:  infected wound (X 1 week, not healing)   HPI: Kyle [name] is a 36 y.o. male presenting on 08/21/2021 for infected wound (X 1 week, not healing)   Pt presents today for evaluation of finger injury and infection. States about 2-3 weeks ago he was working and stabbed a nail through his right index finger. States the wound gradually became infected. States he has stabbed the site with needles to drain the puss and relieve the pressure. His tetanus is not up to date. He denies fever, chills, weakness, or confusion. Limited ROM in finger due to swelling.   Hand Pain  The incident occurred more than 1 week ago. The incident occurred at work. Injury mechanism: puncture from nail. The pain is present in the right fingers. The quality of the pain is described as burning, shooting and stabbing. The pain does not radiate. The pain is moderate. The pain has been Constant since the incident. Pertinent negatives include no chest pain, muscle weakness, numbness or tingling. The symptoms are aggravated by movement and palpation. Treatments tried: draining wound at home with needle. The treatment provided no relief.     Relevant past medical, surgical, family, and social history reviewed and updated as indicated.  Allergies and medications reviewed and updated. Data reviewed: Chart in Epic.   Past Medical History:  Diagnosis Date   Anxiety    Back pain    Bipolar disorder (HCC)    Depression    Neck pain    Seizures (HCC)     Past Surgical History:  Procedure Laterality Date   arm surgery      BLADDER REPAIR N/A 02/25/2020   Procedure: BLADDER REPAIR, CYSTOTOMY CLOSURE;  Surgeon: Sebastian Ache, MD;  Location: [facility] OR;  Service: Urology;  Laterality: N/A;    BOWEL RESECTION N/A 02/25/2020   Procedure: SMALL BOWEL RESECTION;  Surgeon: Quentin Ore, MD;  Location: [facility] OR;  Service: General;  Laterality: N/A;   EYE SURGERY Right    LAPAROTOMY N/A 02/25/2020   Procedure: EXPLORATORY LAPAROTOMY, REPAIR OF COLON;  Surgeon: Quentin Ore, MD;  Location: [facility] OR;  Service: General;  Laterality: N/A;    Social History   Socioeconomic History   Marital status: Divorced    Spouse name: Not on file   Number of children: 1   Years of education: Not on file   Highest education level: GED or equivalent  Occupational History   Not on file  Tobacco Use   Smoking status: Every Day    Packs/day: 1.00    Years: 15.00    Total pack years: 15.00    Types: Cigarettes   Smokeless tobacco: Never  Vaping Use   Vaping Use: Never used  Substance and Sexual Activity   Alcohol use: Yes    Alcohol/week: 2.0 standard drinks of alcohol    Types: 2 Cans of beer per week   Drug use: Not Currently    Comment: denies   Sexual activity: Yes    Birth control/protection: None  Other Topics Concern   Not on file  Social History Narrative   ** Merged History Encounter **       Social Determinants of Health   Financial Resource Strain: Not  on file  Food Insecurity: Not on file  Transportation Needs: Not on file  Physical Activity: Not on file  Stress: Not on file  Social Connections: Not on file  Intimate Partner Violence: Not on file    Outpatient Encounter Medications as of 08/21/2021  Medication Sig   losartan (COZAAR) 25 MG tablet Take by mouth.   sulfamethoxazole-trimethoprim (BACTRIM DS) 800-160 MG tablet Take 1 tablet by mouth 2 (two) times daily for 10 days.   acetaminophen (TYLENOL) 325 MG tablet Take 2 tablets (650 mg total) by mouth every 6 (six) hours as needed.   aspirin 81 MG chewable tablet Chew 81 mg by mouth in the morning and at bedtime.   busPIRone (BUSPAR) 10 MG tablet Take 1 tablet (10 mg total) by mouth 2 (two) times daily.    citalopram (CELEXA) 20 MG tablet Take 20 mg by mouth daily.   gabapentin (NEURONTIN) 300 MG capsule Take 1 capsule (300 mg total) by mouth 3 (three) times daily. (NEEDS TO BE SEEN BEFORE NEXT REFILL)   metoprolol succinate (TOPROL-XL) 25 MG 24 hr tablet TAKE 1 TABLET(25 MG) BY MOUTH DAILY   traZODone (DESYREL) 100 MG tablet Take 1 tablet (100 mg total) by mouth at bedtime.   [DISCONTINUED] amoxicillin-clavulanate (AUGMENTIN) 875-125 MG tablet Take by mouth.   [DISCONTINUED] apixaban (ELIQUIS) 5 MG TABS tablet Take by mouth. (Patient not taking: Reported on 07/16/2021)   [DISCONTINUED] diclofenac (VOLTAREN) 75 MG EC tablet Take 1 tablet (75 mg total) by mouth 2 (two) times daily. (Patient not taking: Reported on 07/16/2021)   [DISCONTINUED] tamsulosin (FLOMAX) 0.4 MG CAPS capsule Take 0.4 mg by mouth daily after supper.   No facility-administered encounter medications on file as of 08/21/2021.    No Known Allergies  Review of Systems  Constitutional:  Negative for activity change, appetite change, chills, diaphoresis, fatigue, fever and unexpected weight change.  Respiratory:  Negative for cough and shortness of breath.   Cardiovascular:  Negative for chest pain, palpitations and leg swelling.  Genitourinary:  Negative for decreased urine volume and difficulty urinating.  Musculoskeletal:  Positive for joint swelling. Negative for arthralgias, back pain, gait problem, myalgias, neck pain and neck stiffness.  Skin:  Positive for color change and wound. Negative for pallor and rash.  Neurological:  Negative for dizziness, tingling, weakness, light-headedness, numbness and headaches.  Psychiatric/Behavioral:  Negative for confusion.   All other systems reviewed and are negative.       Objective:  BP 110/63   Pulse 69   Temp 98.9 F (37.2 C)   Ht 5\' 10"  (1.778 m)   Wt 135 lb (61.2 kg)   SpO2 100%   BMI 19.37 kg/m    Wt Readings from Last 3 Encounters:  08/21/21 135 lb (61.2 kg)   08/06/21 135 lb 3.2 oz (61.3 kg)  07/23/21 140 lb (63.5 kg)    Physical Exam Vitals and nursing note reviewed.  Constitutional:      General: He is not in acute distress.    Appearance: Normal appearance. He is normal weight. He is not ill-appearing, toxic-appearing or diaphoretic.  HENT:     Head: Normocephalic and atraumatic.  Eyes:     Pupils: Pupils are equal, round, and reactive to light.  Cardiovascular:     Rate and Rhythm: Normal rate and regular rhythm.     Pulses: Normal pulses.     Heart sounds: Normal heart sounds.  Pulmonary:     Effort: Pulmonary effort is normal.  Breath sounds: Normal breath sounds.  Skin:    General: Skin is warm and dry.     Capillary Refill: Capillary refill takes less than 2 seconds.     Findings: Abscess, erythema and wound present.     Comments: Puncture wound with abscess to right index finger. Swelling, erythema, and tenderness. Scab to anterior aspect of finger. Decreased flexion and extension of finger due to swelling.   Neurological:     General: No focal deficit present.     Mental Status: He is alert and oriented to person, place, and time.  Psychiatric:        Mood and Affect: Mood normal.        Behavior: Behavior normal.        Thought Content: Thought content normal.        Judgment: Judgment normal.     Results for orders placed or performed during the hospital encounter of 03/27/20  Lipase, blood  Result Value Ref Range   Lipase 35 11 - 51 U/L  Comprehensive metabolic panel  Result Value Ref Range   Sodium 137 135 - 145 mmol/L   Potassium 4.6 3.5 - 5.1 mmol/L   Chloride 102 98 - 111 mmol/L   CO2 25 22 - 32 mmol/L   Glucose, Bld 106 (H) 70 - 99 mg/dL   BUN 12 6 - 20 mg/dL   Creatinine, Ser 4.23 (L) 0.61 - 1.24 mg/dL   Calcium 9.3 8.9 - 53.6 mg/dL   Total Protein 6.9 6.5 - 8.1 g/dL   Albumin 3.5 3.5 - 5.0 g/dL   AST 19 15 - 41 U/L   ALT 26 0 - 44 U/L   Alkaline Phosphatase 95 38 - 126 U/L   Total Bilirubin  0.5 0.3 - 1.2 mg/dL   GFR, Estimated >14 >43 mL/min   Anion gap 10 5 - 15  CBC  Result Value Ref Range   WBC 11.4 (H) 4.0 - 10.5 K/uL   RBC 3.92 (L) 4.22 - 5.81 MIL/uL   Hemoglobin 12.2 (L) 13.0 - 17.0 g/dL   HCT 15.4 (L) 00.8 - 67.6 %   MCV 96.4 80.0 - 100.0 fL   MCH 31.1 26.0 - 34.0 pg   MCHC 32.3 30.0 - 36.0 g/dL   RDW 19.5 09.3 - 26.7 %   Platelets 297 150 - 400 K/uL   nRBC 0.0 0.0 - 0.2 %  Urinalysis, Routine w reflex microscopic Urine, Clean Catch  Result Value Ref Range   Color, Urine YELLOW YELLOW   APPearance HAZY (A) CLEAR   Specific Gravity, Urine 1.019 1.005 - 1.030   pH 5.0 5.0 - 8.0   Glucose, UA NEGATIVE NEGATIVE mg/dL   Hgb urine dipstick NEGATIVE NEGATIVE   Bilirubin Urine NEGATIVE NEGATIVE   Ketones, ur NEGATIVE NEGATIVE mg/dL   Protein, ur NEGATIVE NEGATIVE mg/dL   Nitrite POSITIVE (A) NEGATIVE   Leukocytes,Ua MODERATE (A) NEGATIVE   RBC / HPF 11-20 0 - 5 RBC/hpf   WBC, UA >50 (H) 0 - 5 WBC/hpf   Bacteria, UA FEW (A) NONE SEEN   Squamous Epithelial / LPF 0-5 0 - 5   Mucus PRESENT      X-Ray: right index finger: No acute findings. Preliminary x-ray reading by Kari Baars, FNP-C, [facility].   Pertinent labs & imaging results that were available during my care of the patient were reviewed by me and considered in my medical decision making.  Assessment & Plan:  Kyle [name] was seen today for infected  wound.  Diagnoses and all orders for this visit:  Injury of right index finger, initial encounter Cellulitis of finger of right hand Imaging without noted retained foreign body or osteomyelitis. Will place on bactrim and refer to hand surgery. Pt aware of wound care at home. Aware of red flags. Follow up with hand surgery as discussed. If you do not hear from them in the next 3-4 days, call the office.  -     DG Finger Index Right -     sulfamethoxazole-trimethoprim (BACTRIM DS) 800-160 MG tablet; Take 1 tablet by mouth 2 (two) times daily for 10 days. -      Ambulatory referral to Hand Surgery  Tetanus toxoid vaccination declined Declined in office due to price. Pt aware to go to health department to get tetanus vaccination updated.     Continue all other maintenance medications.  Follow up plan: Return if symptoms worsen or fail to improve.   Continue healthy lifestyle choices, including diet (rich in fruits, vegetables, and lean proteins, and low in salt and simple carbohydrates) and exercise (at least 30 minutes of moderate physical activity daily).  Educational handout given for cellulitis  The above assessment and management plan was discussed with the patient. The patient verbalized understanding of and has agreed to the management plan. Patient is aware to call the clinic if they develop any new symptoms or if symptoms persist or worsen. Patient is aware when to return to the clinic for a follow-up visit. Patient educated on when it is appropriate to go to the emergency department.   Kari Baars, FNP-C [facility] [phone]

## 2021-08-23 ENCOUNTER — Other Ambulatory Visit: Payer: Self-pay | Admitting: Nurse Practitioner

## 2021-08-23 NOTE — Telephone Encounter (Signed)
Last OV 08/21/2021. Last RF 07/23/2021. Next OV 02/04/2022

## 2022-02-04 ENCOUNTER — Encounter: Payer: Self-pay | Admitting: Nurse Practitioner

## 2022-02-04 ENCOUNTER — Ambulatory Visit: Payer: Medicaid Other | Admitting: Nurse Practitioner

## 2022-07-22 ENCOUNTER — Inpatient Hospital Stay: Payer: Medicaid Other | Admitting: Nurse Practitioner
